# Patient Record
Sex: Female | Born: 1970
Health system: Southern US, Community
[De-identification: ages and names within clinical notes are randomized; demographics above are authoritative.]

## PROBLEM LIST (undated history)

## (undated) DIAGNOSIS — F32A Depression, unspecified: Secondary | ICD-10-CM

## (undated) DIAGNOSIS — D649 Anemia, unspecified: Secondary | ICD-10-CM

## (undated) DIAGNOSIS — I10 Essential (primary) hypertension: Secondary | ICD-10-CM

## (undated) DIAGNOSIS — A419 Sepsis, unspecified organism: Secondary | ICD-10-CM

## (undated) DIAGNOSIS — N201 Calculus of ureter: Secondary | ICD-10-CM

## (undated) DIAGNOSIS — N200 Calculus of kidney: Secondary | ICD-10-CM

## (undated) DIAGNOSIS — E119 Type 2 diabetes mellitus without complications: Secondary | ICD-10-CM

## (undated) DIAGNOSIS — R350 Frequency of micturition: Secondary | ICD-10-CM

## (undated) DIAGNOSIS — Z87442 Personal history of urinary calculi: Secondary | ICD-10-CM

## (undated) DIAGNOSIS — F329 Major depressive disorder, single episode, unspecified: Secondary | ICD-10-CM

## (undated) DIAGNOSIS — B009 Herpesviral infection, unspecified: Secondary | ICD-10-CM

## (undated) HISTORY — DX: Essential (primary) hypertension: I10

## (undated) HISTORY — DX: Herpesviral infection, unspecified: B00.9

## (undated) HISTORY — DX: Frequency of micturition: R35.0

---

## 2008-12-16 ENCOUNTER — Encounter: Admission: RE | Admit: 2008-12-16 | Discharge: 2008-12-16 | Payer: Self-pay | Admitting: Obstetrics and Gynecology

## 2008-12-20 ENCOUNTER — Encounter: Admission: RE | Admit: 2008-12-20 | Discharge: 2008-12-20 | Payer: Self-pay | Admitting: Obstetrics and Gynecology

## 2009-06-07 HISTORY — PX: WISDOM TOOTH EXTRACTION: SHX21

## 2009-08-13 ENCOUNTER — Ambulatory Visit (HOSPITAL_COMMUNITY): Admission: RE | Admit: 2009-08-13 | Discharge: 2009-08-13 | Payer: Self-pay | Admitting: General Surgery

## 2009-08-13 HISTORY — PX: LAPAROSCOPIC CHOLECYSTECTOMY: SUR755

## 2010-01-26 ENCOUNTER — Encounter: Admission: RE | Admit: 2010-01-26 | Discharge: 2010-01-26 | Payer: Self-pay | Admitting: Obstetrics and Gynecology

## 2010-02-02 ENCOUNTER — Encounter: Admission: RE | Admit: 2010-02-02 | Discharge: 2010-02-02 | Payer: Self-pay | Admitting: Obstetrics and Gynecology

## 2010-03-11 ENCOUNTER — Encounter
Admission: RE | Admit: 2010-03-11 | Discharge: 2010-03-11 | Payer: Self-pay | Source: Home / Self Care | Attending: Family Medicine | Admitting: Family Medicine

## 2010-06-29 ENCOUNTER — Encounter: Payer: Self-pay | Admitting: Obstetrics and Gynecology

## 2010-08-31 LAB — COMPREHENSIVE METABOLIC PANEL
ALT: 23 U/L (ref 0–35)
AST: 21 U/L (ref 0–37)
Albumin: 3.4 g/dL — ABNORMAL LOW (ref 3.5–5.2)
Alkaline Phosphatase: 78 U/L (ref 39–117)
BUN: 7 mg/dL (ref 6–23)
GFR calc Af Amer: >60 mL/min (ref >60)
Potassium: 4.1 mEq/L (ref 3.5–5.1)
Sodium: 138 mEq/L (ref 135–145)

## 2010-08-31 LAB — DIFFERENTIAL
Eosinophils Relative: 3 % (ref 0–5)
Lymphocytes Relative: 20 % (ref 12–46)
Lymphs Abs: 1.7 10*3/uL (ref 0.7–4.0)
Monocytes Relative: 7 % (ref 3–12)
Neutro Abs: 5.9 10*3/uL (ref 1.7–7.7)
Neutrophils Relative %: 70 % (ref 43–77)

## 2010-08-31 LAB — CBC
HCT: 40.3 % (ref 36.0–46.0)
RBC: 4.53 MIL/uL (ref 3.87–5.11)
WBC: 8.5 10*3/uL (ref 4.0–10.5)

## 2010-08-31 LAB — PREGNANCY, URINE: Preg Test, Ur: NEGATIVE

## 2011-01-18 ENCOUNTER — Other Ambulatory Visit: Payer: Self-pay | Admitting: Obstetrics and Gynecology

## 2011-01-18 DIAGNOSIS — Z1231 Encounter for screening mammogram for malignant neoplasm of breast: Secondary | ICD-10-CM

## 2011-02-05 ENCOUNTER — Ambulatory Visit
Admission: RE | Admit: 2011-02-05 | Discharge: 2011-02-05 | Disposition: A | Discharge status: Home / Self Care | Payer: Managed Care, Other (non HMO) | Source: Ambulatory Visit | Attending: Obstetrics and Gynecology | Admitting: Obstetrics and Gynecology

## 2011-02-05 DIAGNOSIS — Z1231 Encounter for screening mammogram for malignant neoplasm of breast: Secondary | ICD-10-CM

## 2012-02-28 ENCOUNTER — Other Ambulatory Visit: Payer: Self-pay | Admitting: Obstetrics and Gynecology

## 2012-02-28 DIAGNOSIS — Z139 Encounter for screening, unspecified: Secondary | ICD-10-CM

## 2012-03-06 ENCOUNTER — Inpatient Hospital Stay (HOSPITAL_BASED_OUTPATIENT_CLINIC_OR_DEPARTMENT_OTHER): Admission: RE | Admit: 2012-03-06 | Payer: Managed Care, Other (non HMO) | Source: Ambulatory Visit

## 2012-03-06 ENCOUNTER — Other Ambulatory Visit: Payer: Self-pay | Admitting: Obstetrics and Gynecology

## 2012-03-06 DIAGNOSIS — Z139 Encounter for screening, unspecified: Secondary | ICD-10-CM

## 2012-03-10 ENCOUNTER — Ambulatory Visit
Admission: RE | Admit: 2012-03-10 | Discharge: 2012-03-10 | Disposition: A | Discharge status: Home / Self Care | Payer: Managed Care, Other (non HMO) | Source: Ambulatory Visit | Attending: Obstetrics and Gynecology | Admitting: Obstetrics and Gynecology

## 2012-03-10 DIAGNOSIS — Z139 Encounter for screening, unspecified: Secondary | ICD-10-CM

## 2012-04-13 ENCOUNTER — Ambulatory Visit (INDEPENDENT_AMBULATORY_CARE_PROVIDER_SITE_OTHER): Payer: Private Health Insurance - Indemnity | Admitting: Obstetrics and Gynecology

## 2012-04-13 ENCOUNTER — Encounter: Payer: Self-pay | Admitting: Obstetrics and Gynecology

## 2012-04-13 VITALS — BP 100/70 | Ht 66.5 in | Wt 232.0 lb

## 2012-04-13 DIAGNOSIS — R350 Frequency of micturition: Secondary | ICD-10-CM | POA: Insufficient documentation

## 2012-04-13 DIAGNOSIS — B009 Herpesviral infection, unspecified: Secondary | ICD-10-CM | POA: Insufficient documentation

## 2012-04-13 DIAGNOSIS — L309 Dermatitis, unspecified: Secondary | ICD-10-CM | POA: Insufficient documentation

## 2012-04-13 DIAGNOSIS — I10 Essential (primary) hypertension: Secondary | ICD-10-CM | POA: Insufficient documentation

## 2012-04-13 DIAGNOSIS — Z8619 Personal history of other infectious and parasitic diseases: Secondary | ICD-10-CM | POA: Insufficient documentation

## 2012-04-13 DIAGNOSIS — N83209 Unspecified ovarian cyst, unspecified side: Secondary | ICD-10-CM | POA: Insufficient documentation

## 2012-04-13 DIAGNOSIS — O139 Gestational [pregnancy-induced] hypertension without significant proteinuria, unspecified trimester: Secondary | ICD-10-CM | POA: Insufficient documentation

## 2012-04-13 DIAGNOSIS — Z124 Encounter for screening for malignant neoplasm of cervix: Secondary | ICD-10-CM

## 2012-04-13 DIAGNOSIS — Z01419 Encounter for gynecological examination (general) (routine) without abnormal findings: Secondary | ICD-10-CM

## 2012-04-13 MED ORDER — TRIAMCINOLONE 0.1 % CREAM:EUCERIN CREAM 1:1: 1.0000 "application " | TOPICAL_CREAM | Freq: Three times a day (TID) | CUTANEOUS | Status: DC | PRN

## 2012-04-13 MED ORDER — VALACYCLOVIR HCL 500 MG PO TABS: 500.0000 mg | ORAL_TABLET | Freq: Two times a day (BID) | ORAL | Status: DC

## 2012-04-13 NOTE — Progress Notes (Signed)
The patient reports: some vaginal itching   Contraception:None  Last mammogram: 02/18/2012 Normal Last pap: 03/04/2011   GC/Chlamydia cultures offered: declined HIV/RPR/HbsAg offered:  declined HSV 1 and 2 glycoprotein offered: declined  Menstrual cycle regular and monthly: Yes   Did not have a cycle in September  Menstrual flow normal: Yes  Urinary symptoms: none Normal bowel movements: Yes Reports abuse at home: No:  Subjective:    Katherine Gay is a 41 y.o. female, [id number], who presents for an annual exam.     History   Social History  . Marital Status: Married    Spouse Name: N/A    Number of Children: N/A  . Years of Education: N/A   Social History Main Topics  . Smoking status: Never Smoker   . Smokeless tobacco: Never Used  . Alcohol Use: No  . Drug Use: No  . Sexually Active: Yes   Other Topics Concern  . None   Social History Narrative  . None    Menstrual cycle:   LMP: No LMP recorded.           Cycle: normal except September  The following portions of the patient's history were reviewed and updated as appropriate: allergies, current medications, past family history, past medical history, past social history, past surgical history and problem list.  Review of Systems Pertinent items are noted in HPI. Breast:Negative for breast lump,nipple discharge or nipple retraction Gastrointestinal: Negative for abdominal pain, change in bowel habits or rectal bleeding Urinary:negative   Objective:    There were no vitals taken for this visit.    Weight:  Wt Readings from Last 1 Encounters:  No data found for Wt          BMI: There is no height or weight on file to calculate BMI.  General Appearance: Alert, appropriate appearance for age. No acute distress HEENT: Grossly normal Neck / Thyroid: Supple, no masses, nodes or enlargement Lungs: clear to auscultation bilaterally Back: No CVA tenderness Breast Exam: No masses or nodes.No dimpling, nipple  retraction or discharge. Cardiovascular: Regular rate and rhythm. S1, S2, no murmur Gastrointestinal: Soft, non-tender, no masses or organomegaly Pelvic Exam: Vulva and vagina appear normal. Bimanual exam reveals normal uterus and adnexa. Rectovaginal: normal rectal, no masses Lymphatic Exam: Non-palpable nodes in neck, clavicular, axillary, or inguinal regions Skin: no rash or abnormalities Neurologic: Normal gait and speech, no tremor  Psychiatric: Alert and oriented, appropriate affect.     Assessment:    Normal gyn exam    Plan:    mammogram pap smear return annually or prn STD screening: declined Contraception:no method   Silverio Lay MD

## 2012-04-14 LAB — PAP IG W/ RFLX HPV ASCU

## 2012-04-18 ENCOUNTER — Other Ambulatory Visit: Payer: Self-pay

## 2012-04-18 NOTE — Progress Notes (Signed)
rx verification for triamcinolone / eucerin cream sent to [facility].  Darien Ramus

## 2012-04-24 ENCOUNTER — Other Ambulatory Visit: Payer: Self-pay | Admitting: Obstetrics and Gynecology

## 2012-04-24 NOTE — Telephone Encounter (Signed)
Spoke with pt requesting Valtrex rx. Informed pt Dr. Lynford Humphrey sent rf's for Valtrex on 04/13/12 to [facility]. Pt states she needs a rx called to [facility] today because [facility] home delivery takes 7-14 days. Informed pt we will call in ONE Valcyclovir rx to [facility]. Pt agrees and comprehends.

## 2012-06-10 ENCOUNTER — Other Ambulatory Visit: Payer: Self-pay | Admitting: Obstetrics and Gynecology

## 2012-07-05 ENCOUNTER — Telehealth: Payer: Self-pay | Admitting: Obstetrics and Gynecology

## 2012-07-05 NOTE — Telephone Encounter (Signed)
Per protocol, I called a 90 day supply of Valtrex 500 mg sig: 1 po bid # 180 w 2 RF's to Mail order pharmacy. Also, per SR, I called in Triamcinolone cream 0.1% cream 30 g tube to use qd or bid prn with [facility]. Pt was notified. Melody Comas A

## 2013-02-12 ENCOUNTER — Other Ambulatory Visit: Payer: Self-pay

## 2013-02-12 DIAGNOSIS — Z1231 Encounter for screening mammogram for malignant neoplasm of breast: Secondary | ICD-10-CM

## 2013-03-12 ENCOUNTER — Ambulatory Visit
Admission: RE | Admit: 2013-03-12 | Discharge: 2013-03-12 | Disposition: A | Discharge status: Home / Self Care | Payer: Managed Care, Other (non HMO) | Source: Ambulatory Visit

## 2013-03-12 DIAGNOSIS — Z1231 Encounter for screening mammogram for malignant neoplasm of breast: Secondary | ICD-10-CM

## 2014-02-05 ENCOUNTER — Other Ambulatory Visit: Payer: Self-pay

## 2014-02-05 DIAGNOSIS — Z1231 Encounter for screening mammogram for malignant neoplasm of breast: Secondary | ICD-10-CM

## 2014-03-14 ENCOUNTER — Ambulatory Visit: Payer: Managed Care, Other (non HMO)

## 2014-03-21 ENCOUNTER — Ambulatory Visit
Admission: RE | Admit: 2014-03-21 | Discharge: 2014-03-21 | Disposition: A | Discharge status: Home / Self Care | Payer: Managed Care, Other (non HMO) | Source: Ambulatory Visit

## 2014-03-21 DIAGNOSIS — Z1231 Encounter for screening mammogram for malignant neoplasm of breast: Secondary | ICD-10-CM

## 2014-04-08 ENCOUNTER — Encounter: Payer: Self-pay | Admitting: Obstetrics and Gynecology

## 2014-11-27 ENCOUNTER — Other Ambulatory Visit (HOSPITAL_BASED_OUTPATIENT_CLINIC_OR_DEPARTMENT_OTHER): Payer: Self-pay | Admitting: Physician Assistant

## 2014-11-27 ENCOUNTER — Ambulatory Visit (HOSPITAL_BASED_OUTPATIENT_CLINIC_OR_DEPARTMENT_OTHER)
Admission: RE | Admit: 2014-11-27 | Discharge: 2014-11-27 | Disposition: A | Discharge status: Home / Self Care | Payer: Managed Care, Other (non HMO) | Source: Ambulatory Visit | Attending: Physician Assistant | Admitting: Physician Assistant

## 2014-11-27 DIAGNOSIS — N2882 Megaloureter: Secondary | ICD-10-CM | POA: Insufficient documentation

## 2014-11-27 DIAGNOSIS — R109 Unspecified abdominal pain: Secondary | ICD-10-CM | POA: Diagnosis present

## 2014-11-27 DIAGNOSIS — N200 Calculus of kidney: Secondary | ICD-10-CM | POA: Insufficient documentation

## 2014-11-27 DIAGNOSIS — K760 Fatty (change of) liver, not elsewhere classified: Secondary | ICD-10-CM | POA: Insufficient documentation

## 2015-07-16 ENCOUNTER — Other Ambulatory Visit: Payer: Self-pay | Admitting: Urology

## 2015-07-28 ENCOUNTER — Encounter (HOSPITAL_BASED_OUTPATIENT_CLINIC_OR_DEPARTMENT_OTHER): Payer: Self-pay | Admitting: *Deleted

## 2015-07-29 ENCOUNTER — Encounter (HOSPITAL_BASED_OUTPATIENT_CLINIC_OR_DEPARTMENT_OTHER): Payer: Self-pay | Admitting: *Deleted

## 2015-07-29 NOTE — Progress Notes (Signed)
NPO AFTER MN.  ARRIVE AT 0715.  NEEDS ISTAT AND EKG.  WILL TAKE COZAAR AM DOS W/ SIPS OF WATER AND IF NEEDED TAKE ONE TYPE PAIN MED.

## 2015-07-30 NOTE — Anesthesia Preprocedure Evaluation (Addendum)
Anesthesia Evaluation  Patient identified by MRN, date of birth, ID band Patient awake    Reviewed: Allergy & Precautions, H&P , Patient's Chart, lab work & pertinent test results, reviewed documented beta blocker date and time   Airway Mallampati: II  TM Distance: >3 FB Neck ROM: full    Dental no notable dental hx. (+)    Pulmonary    Pulmonary exam normal breath sounds clear to auscultation       Cardiovascular hypertension, On Medications  Rhythm:regular Rate:Normal     Neuro/Psych    GI/Hepatic   Endo/Other  diabetes  Renal/GU      Musculoskeletal   Abdominal   Peds  Hematology   Anesthesia Other Findings   Reproductive/Obstetrics                            Anesthesia Physical Anesthesia Plan  ASA: II  Anesthesia Plan:    Post-op Pain Management:    Induction: Intravenous  Airway Management Planned: Oral ETT  Additional Equipment:   Intra-op Plan:   Post-operative Plan:   Informed Consent: I have reviewed the patients History and Physical, chart, labs and discussed the procedure including the risks, benefits and alternatives for the proposed anesthesia with the patient or authorized representative who has indicated his/her understanding and acceptance.   Dental Advisory Given and Dental advisory given  Plan Discussed with: CRNA and Surgeon  Anesthesia Plan Comments: (Am BS 208 Discussed GA with ETT, possible sore throat, potential dental injury( upper Caps)  N/V, pulmonary aspiration. Questions answered. )       Anesthesia Quick Evaluation

## 2015-08-01 ENCOUNTER — Encounter (HOSPITAL_BASED_OUTPATIENT_CLINIC_OR_DEPARTMENT_OTHER): Payer: Self-pay | Admitting: *Deleted

## 2015-08-01 ENCOUNTER — Ambulatory Visit (HOSPITAL_BASED_OUTPATIENT_CLINIC_OR_DEPARTMENT_OTHER): Payer: Managed Care, Other (non HMO) | Admitting: Anesthesiology

## 2015-08-01 ENCOUNTER — Encounter (HOSPITAL_BASED_OUTPATIENT_CLINIC_OR_DEPARTMENT_OTHER)
Admission: RE | Disposition: A | Discharge status: Home / Self Care | Payer: Self-pay | Source: Ambulatory Visit | Attending: Urology

## 2015-08-01 ENCOUNTER — Ambulatory Visit (HOSPITAL_BASED_OUTPATIENT_CLINIC_OR_DEPARTMENT_OTHER)
Admission: RE | Admit: 2015-08-01 | Discharge: 2015-08-01 | Disposition: A | Discharge status: Home / Self Care | Payer: Managed Care, Other (non HMO) | Source: Ambulatory Visit | Attending: Urology | Admitting: Urology

## 2015-08-01 DIAGNOSIS — R109 Unspecified abdominal pain: Secondary | ICD-10-CM | POA: Diagnosis present

## 2015-08-01 DIAGNOSIS — N201 Calculus of ureter: Secondary | ICD-10-CM | POA: Insufficient documentation

## 2015-08-01 DIAGNOSIS — N200 Calculus of kidney: Secondary | ICD-10-CM

## 2015-08-01 DIAGNOSIS — I10 Essential (primary) hypertension: Secondary | ICD-10-CM | POA: Diagnosis not present

## 2015-08-01 DIAGNOSIS — Z7984 Long term (current) use of oral hypoglycemic drugs: Secondary | ICD-10-CM | POA: Insufficient documentation

## 2015-08-01 DIAGNOSIS — Z841 Family history of disorders of kidney and ureter: Secondary | ICD-10-CM | POA: Diagnosis not present

## 2015-08-01 DIAGNOSIS — Z79899 Other long term (current) drug therapy: Secondary | ICD-10-CM | POA: Insufficient documentation

## 2015-08-01 DIAGNOSIS — E119 Type 2 diabetes mellitus without complications: Secondary | ICD-10-CM | POA: Insufficient documentation

## 2015-08-01 DIAGNOSIS — Z791 Long term (current) use of non-steroidal anti-inflammatories (NSAID): Secondary | ICD-10-CM | POA: Diagnosis not present

## 2015-08-01 DIAGNOSIS — Z79891 Long term (current) use of opiate analgesic: Secondary | ICD-10-CM | POA: Diagnosis not present

## 2015-08-01 HISTORY — DX: Depression, unspecified: F32.A

## 2015-08-01 HISTORY — DX: Type 2 diabetes mellitus without complications: E11.9

## 2015-08-01 HISTORY — DX: Calculus of ureter: N20.1

## 2015-08-01 HISTORY — PX: HOLMIUM LASER APPLICATION: SHX5852

## 2015-08-01 HISTORY — PX: CYSTOSCOPY WITH RETROGRADE PYELOGRAM, URETEROSCOPY AND STENT PLACEMENT: SHX5789

## 2015-08-01 HISTORY — DX: Major depressive disorder, single episode, unspecified: F32.9

## 2015-08-01 HISTORY — DX: Calculus of kidney: N20.0

## 2015-08-01 LAB — POCT I-STAT, CHEM 8
BUN: 11 mg/dL (ref 6–20)
CALCIUM ION: 1.21 mmol/L (ref 1.12–1.23)
CREATININE: 0.6 mg/dL (ref 0.44–1.00)
Chloride: 102 mmol/L (ref 101–111)
GLUCOSE: 208 mg/dL — AB (ref 65–99)
HEMATOCRIT: 44 % (ref 36.0–46.0)
Hemoglobin: 15 g/dL (ref 12.0–15.0)
Potassium: 4.2 mmol/L (ref 3.5–5.1)
Sodium: 139 mmol/L (ref 135–145)
TCO2: 22 mmol/L (ref 0–100)

## 2015-08-01 LAB — GLUCOSE, CAPILLARY: Glucose-Capillary: 148 mg/dL — ABNORMAL HIGH (ref 65–99)

## 2015-08-01 SURGERY — CYSTOURETEROSCOPY, WITH RETROGRADE PYELOGRAM AND STENT INSERTION
Anesthesia: General | Site: Ureter | Laterality: Right

## 2015-08-01 MED ORDER — CIPROFLOXACIN IN D5W 400 MG/200ML IV SOLN
INTRAVENOUS | Status: AC
  Filled 2015-08-01: qty 200

## 2015-08-01 MED ORDER — MIDAZOLAM HCL 5 MG/5ML IJ SOLN
INTRAMUSCULAR | Status: DC | PRN
  Administered 2015-08-01: 2 mg via INTRAVENOUS

## 2015-08-01 MED ORDER — MIDAZOLAM HCL 2 MG/2ML IJ SOLN
INTRAMUSCULAR | Status: AC
  Filled 2015-08-01: qty 2

## 2015-08-01 MED ORDER — ONDANSETRON HCL 4 MG/2ML IJ SOLN
INTRAMUSCULAR | Status: AC
  Filled 2015-08-01: qty 2

## 2015-08-01 MED ORDER — ONDANSETRON HCL 4 MG/2ML IJ SOLN
4.0000 mg | Freq: Once | INTRAMUSCULAR | Status: AC
  Administered 2015-08-01: 4 mg via INTRAVENOUS
  Filled 2015-08-01: qty 2

## 2015-08-01 MED ORDER — LIDOCAINE HCL (CARDIAC) 20 MG/ML IV SOLN
INTRAVENOUS | Status: AC
  Filled 2015-08-01: qty 5

## 2015-08-01 MED ORDER — CIPROFLOXACIN IN D5W 400 MG/200ML IV SOLN
400.0000 mg | INTRAVENOUS | Status: AC
  Administered 2015-08-01: 400 mg via INTRAVENOUS
  Filled 2015-08-01: qty 200

## 2015-08-01 MED ORDER — DEXAMETHASONE SODIUM PHOSPHATE 10 MG/ML IJ SOLN
INTRAMUSCULAR | Status: AC
  Filled 2015-08-01: qty 1

## 2015-08-01 MED ORDER — PROMETHAZINE HCL 25 MG/ML IJ SOLN
INTRAMUSCULAR | Status: AC
  Filled 2015-08-01: qty 1

## 2015-08-01 MED ORDER — ACETAMINOPHEN 160 MG/5ML PO SOLN
975.0000 mg | Freq: Once | ORAL | Status: AC
  Administered 2015-08-01: 975 mg via ORAL
  Filled 2015-08-01: qty 30.5

## 2015-08-01 MED ORDER — ROCURONIUM BROMIDE 100 MG/10ML IV SOLN
INTRAVENOUS | Status: AC
  Filled 2015-08-01: qty 1

## 2015-08-01 MED ORDER — FENTANYL CITRATE (PF) 100 MCG/2ML IJ SOLN
INTRAMUSCULAR | Status: AC
  Filled 2015-08-01: qty 2

## 2015-08-01 MED ORDER — PROPOFOL 10 MG/ML IV BOLUS
INTRAVENOUS | Status: AC
  Filled 2015-08-01: qty 20

## 2015-08-01 MED ORDER — SODIUM CHLORIDE 0.9 % IR SOLN
Status: DC | PRN
  Administered 2015-08-01: 3000 mL
  Administered 2015-08-01: 1000 mL

## 2015-08-01 MED ORDER — FENTANYL CITRATE (PF) 250 MCG/5ML IJ SOLN
INTRAMUSCULAR | Status: AC
  Filled 2015-08-01: qty 5

## 2015-08-01 MED ORDER — KETOROLAC TROMETHAMINE 30 MG/ML IJ SOLN
INTRAMUSCULAR | Status: AC
  Filled 2015-08-01: qty 1

## 2015-08-01 MED ORDER — SCOPOLAMINE 1 MG/3DAYS TD PT72
MEDICATED_PATCH | TRANSDERMAL | Status: AC
  Filled 2015-08-01: qty 1

## 2015-08-01 MED ORDER — GLYCOPYRROLATE 0.2 MG/ML IJ SOLN
INTRAMUSCULAR | Status: AC
  Filled 2015-08-01: qty 1

## 2015-08-01 MED ORDER — LACTATED RINGERS IV SOLN
INTRAVENOUS | Status: DC
  Administered 2015-08-01 (×2): via INTRAVENOUS
  Filled 2015-08-01: qty 1000

## 2015-08-01 MED ORDER — TROSPIUM CHLORIDE ER 60 MG PO CP24: 60.0000 mg | ORAL_CAPSULE | Freq: Every day | ORAL | Status: DC

## 2015-08-01 MED ORDER — FENTANYL CITRATE (PF) 100 MCG/2ML IJ SOLN
25.0000 ug | INTRAMUSCULAR | Status: DC | PRN
  Administered 2015-08-01 (×3): 25 ug via INTRAVENOUS
  Filled 2015-08-01: qty 1

## 2015-08-01 MED ORDER — ONDANSETRON HCL 4 MG/2ML IJ SOLN
INTRAMUSCULAR | Status: DC | PRN
  Administered 2015-08-01: 4 mg via INTRAVENOUS

## 2015-08-01 MED ORDER — ROCURONIUM BROMIDE 100 MG/10ML IV SOLN
INTRAVENOUS | Status: DC | PRN
  Administered 2015-08-01: 10 mg via INTRAVENOUS

## 2015-08-01 MED ORDER — CIPROFLOXACIN HCL 500 MG PO TABS: 500.0000 mg | ORAL_TABLET | Freq: Once | ORAL | Status: DC

## 2015-08-01 MED ORDER — PROPOFOL 10 MG/ML IV BOLUS
INTRAVENOUS | Status: DC | PRN
  Administered 2015-08-01: 20 mg via INTRAVENOUS
  Administered 2015-08-01: 200 mg via INTRAVENOUS

## 2015-08-01 MED ORDER — FENTANYL CITRATE (PF) 100 MCG/2ML IJ SOLN
INTRAMUSCULAR | Status: DC | PRN
  Administered 2015-08-01 (×2): 50 ug via INTRAVENOUS
  Administered 2015-08-01: 150 ug via INTRAVENOUS

## 2015-08-01 MED ORDER — BELLADONNA ALKALOIDS-OPIUM 16.2-60 MG RE SUPP
RECTAL | Status: AC
  Filled 2015-08-01: qty 1

## 2015-08-01 MED ORDER — SUCCINYLCHOLINE CHLORIDE 20 MG/ML IJ SOLN
INTRAMUSCULAR | Status: DC | PRN
  Administered 2015-08-01: 80 mg via INTRAVENOUS

## 2015-08-01 MED ORDER — BELLADONNA ALKALOIDS-OPIUM 16.2-60 MG RE SUPP
RECTAL | Status: DC | PRN
Start: 2015-08-01 — End: 2015-08-01
  Administered 2015-08-01: 1 via RECTAL

## 2015-08-01 MED ORDER — IOHEXOL 350 MG/ML SOLN
INTRAVENOUS | Status: DC | PRN
  Administered 2015-08-01: 20 mL

## 2015-08-01 MED ORDER — PROMETHAZINE HCL 25 MG/ML IJ SOLN
6.2500 mg | Freq: Once | INTRAMUSCULAR | Status: AC
  Administered 2015-08-01: 6.25 mg via INTRAVENOUS
  Filled 2015-08-01: qty 1

## 2015-08-01 MED ORDER — KETOROLAC TROMETHAMINE 30 MG/ML IJ SOLN
30.0000 mg | Freq: Once | INTRAMUSCULAR | Status: AC
  Administered 2015-08-01: 30 mg via INTRAVENOUS
  Filled 2015-08-01: qty 1

## 2015-08-01 MED ORDER — SCOPOLAMINE 1 MG/3DAYS TD PT72
1.0000 | MEDICATED_PATCH | TRANSDERMAL | Status: DC
  Administered 2015-08-01: 1.5 mg via TRANSDERMAL
  Filled 2015-08-01: qty 1

## 2015-08-01 MED ORDER — LIDOCAINE HCL (CARDIAC) 20 MG/ML IV SOLN
INTRAVENOUS | Status: DC | PRN
  Administered 2015-08-01: 80 mg via INTRAVENOUS

## 2015-08-01 MED ORDER — ACETAMINOPHEN 160 MG/5ML PO SOLN
ORAL | Status: AC
  Filled 2015-08-01: qty 40.6

## 2015-08-01 MED ORDER — ACETAMINOPHEN-CODEINE #3 300-30 MG PO TABS: 1.0000 | ORAL_TABLET | ORAL | Status: DC | PRN

## 2015-08-01 MED ORDER — PHENAZOPYRIDINE HCL 200 MG PO TABS: 200.0000 mg | ORAL_TABLET | Freq: Three times a day (TID) | ORAL | Status: DC | PRN

## 2015-08-01 SURGICAL SUPPLY — 34 items
BAG DRAIN URO-CYSTO SKYTR STRL (DRAIN) ×2 IMPLANT
BASKET DAKOTA 1.9FR 11X120 (BASKET) ×2 IMPLANT
BASKET LASER NITINOL 1.9FR (BASKET) IMPLANT
BASKET STNLS GEMINI 4WIRE 3FR (BASKET) IMPLANT
BASKET STONE NCOMPASS (UROLOGICAL SUPPLIES) ×2 IMPLANT
BASKET ZERO TIP NITINOL 2.4FR (BASKET) IMPLANT
CATH URET 5FR 28IN OPEN ENDED (CATHETERS) ×4 IMPLANT
CLOTH BEACON ORANGE TIMEOUT ST (SAFETY) ×2 IMPLANT
FIBER LASER TRAC TIP (UROLOGICAL SUPPLIES) ×2 IMPLANT
GLOVE BIO SURGEON STRL SZ 6.5 (GLOVE) ×6 IMPLANT
GLOVE BIO SURGEON STRL SZ7.5 (GLOVE) ×2 IMPLANT
GLOVE INDICATOR 6.5 STRL GRN (GLOVE) ×6 IMPLANT
GOWN STRL REUS W/ TWL LRG LVL3 (GOWN DISPOSABLE) ×2 IMPLANT
GOWN STRL REUS W/ TWL XL LVL3 (GOWN DISPOSABLE) ×1 IMPLANT
GOWN STRL REUS W/TWL LRG LVL3 (GOWN DISPOSABLE) ×2
GOWN STRL REUS W/TWL XL LVL3 (GOWN DISPOSABLE) ×1
GUIDEWIRE 0.038 PTFE COATED (WIRE) ×2 IMPLANT
GUIDEWIRE ANG ZIPWIRE 038X150 (WIRE) IMPLANT
GUIDEWIRE STR DUAL SENSOR (WIRE) ×2 IMPLANT
IV NS 1000ML (IV SOLUTION) ×1
IV NS 1000ML BAXH (IV SOLUTION) ×1 IMPLANT
IV NS IRRIG 3000ML ARTHROMATIC (IV SOLUTION) ×2 IMPLANT
KIT BALLIN UROMAX 15FX10 (LABEL) IMPLANT
KIT BALLN UROMAX 15FX4 (MISCELLANEOUS) IMPLANT
KIT BALLN UROMAX 26 75X4 (MISCELLANEOUS)
KIT ROOM TURNOVER WOR (KITS) ×2 IMPLANT
MANIFOLD NEPTUNE II (INSTRUMENTS) ×2 IMPLANT
NS IRRIG 500ML POUR BTL (IV SOLUTION) IMPLANT
PACK CYSTO (CUSTOM PROCEDURE TRAY) ×2 IMPLANT
SET HIGH PRES BAL DIL (LABEL)
SHEATH ACCESS URETERAL 38CM (SHEATH) ×2 IMPLANT
STENT URET 6FRX24 CONTOUR (STENTS) ×2 IMPLANT
TUBE CONNECTING 12X1/4 (SUCTIONS) ×2 IMPLANT
WATER STERILE IRR 500ML POUR (IV SOLUTION) ×2 IMPLANT

## 2015-08-01 NOTE — Transfer of Care (Signed)
Immediate Anesthesia Transfer of Care Note  Patient: Katherine Gay  Procedure(s) Performed: Procedure(s): CYSTOSCOPY WITH RIGHT RETROGRADE PYELOGRAM, URETEROSCOPY AND STENT PLACEMENT (Right) HOLMIUM LASER APPLICATION (Right)  Patient Location: PACU  Anesthesia Type:General  Level of Consciousness: awake, alert  and oriented  Airway & Oxygen Therapy: Patient Spontanous Breathing and Patient connected to nasal cannula oxygen  Post-op Assessment: Report given to RN and Post -op Vital signs reviewed and stable  Post vital signs: Reviewed and stable  Last Vitals:  Filed Vitals:   08/01/15 0728  BP: 125/82  Pulse: 71  Temp: 36.4 C  Resp: 16    Complications: No apparent anesthesia complications

## 2015-08-01 NOTE — Anesthesia Postprocedure Evaluation (Signed)
Anesthesia Post Note  Patient: Katherine Gay  Procedure(s) Performed: Procedure(s) (LRB): CYSTOSCOPY WITH RIGHT RETROGRADE PYELOGRAM, URETEROSCOPY AND STENT PLACEMENT (Right) HOLMIUM LASER APPLICATION (Right)  Patient location during evaluation: PACU Anesthesia Type: General Level of consciousness: sedated Pain management: satisfactory to patient Vital Signs Assessment: post-procedure vital signs reviewed and stable Respiratory status: spontaneous breathing Cardiovascular status: stable Anesthetic complications: no    Last Vitals:  Filed Vitals:   08/01/15 1245 08/01/15 1250  BP: 123/89   Pulse: 55 55  Temp:    Resp: 10 12    Last Pain:  Filed Vitals:   08/01/15 1300  PainSc: 6                  Darolyn Double EDWARD

## 2015-08-01 NOTE — Op Note (Signed)
Preoperative diagnosis: right ureteral calculus  Postoperative diagnosis: right ureteral calculus  Procedure:  1. Cystoscopy 2. right ureteroscopy and stone removal 3. Ureteroscopic laser lithotripsy 4. right 42F x 24cm ureteral stent placement  5. right retrograde pyelography with interpretation  Surgeon: Crist Fat, MD  Anesthesia: General  Complications: None  Intraoperative findings: Right retrograde pyelography demonstrated a filling defect within the right UPJ consistent with the patient's known calculus without other abnormalities.  EBL: Minimal  Specimens: 1. right ureteral calculus  Disposition of specimens: [facility] for stone analysis  Indication: Katherine Gay is a 45 y.o.   patient with urolithiasis. After reviewing the management options for treatment, the patient elected to proceed with the above surgical procedure(s). We have discussed the potential benefits and risks of the procedure, side effects of the proposed treatment, the likelihood of the patient achieving the goals of the procedure, and any potential problems that might occur during the procedure or recuperation. Informed consent has been obtained.  Description of procedure:  The patient was taken to the operating room and general anesthesia was induced.  The patient was placed in the dorsal lithotomy position, prepped and draped in the usual sterile fashion, and preoperative antibiotics were administered. A preoperative time-out was performed.   Cystourethroscopy was performed.  The patient's urethra was examined and was normal. The bladder was then systematically examined in its entirety. There was no evidence for any bladder tumors, stones, or other mucosal pathology.    Attention then turned to the right ureteral orifice and a ureteral catheter was used to intubate the ureteral orifice.  Omnipaque contrast was injected through the ureteral catheter and a retrograde pyelogram  was performed with findings as dictated above.  A 0.38 sensor guidewire was then advanced up the right ureter into the renal pelvis under fluoroscopic guidance. The 6 Fr semirigid ureteroscope was then advanced into the ureter next to the guidewire and up to the renal pelvis. I then advanced a second PTFE wire through the rigid scope and into the renal pelvis backing out the scope over the wire. I then used a inner portion of the 12/14 [place] ureteral access sheath and dilated the distal ureter. I was unable to advance the ureteral access sheath into the ureter without significant pressure and up into the proximal ureter. I then removed the inner portion of the access sheath along with the PTFE wire. I then advanced a flexible ureteroscope through the sheath and into the renal pelvis..   The stone encountered and was then fragmented with the 200 micron holmium laser fiber on a setting of 0.6 and frequency of 6 Hz.   All large stone fragments were then removed from the ureter with an Stollings nitinol basket.  There were numerous small bits of stone that were too small to be grasped with the basket which were left in the renal pelvic area. I then swept the ureter using the escape basket ensuring that all stone fragments had been removed and simultaneously backing out the ureteral access sheath. No significant ureteral trauma was noted.  The open-ended ureteral catheter was then advanced over the 0.38 sensor wire and into the proximal ureter removing the wire. I then injected 5 mL Omnipaque contrast into the collecting system to help opacify it. I then exchanged the open-ended catheter. The wire again and advanced a 6 [place] times 24 cm double-J ureteral stent over the wire and into the right renal pelvis under fluoroscopic guidance. I then advanced a wire  to the urethral meatus and with pressure against the suprapubic bone and wire all the wire back. Stent was noted to coil into the bladder and a nice curl was  noted in the renal pelvis as well as under fluoroscopy. The stent tether was left on the distal and of the stent and tucked into the patient's vagina.  The bladder was then emptied and the procedure ended.  The patient appeared to tolerate the procedure well and without complications.  The patient was able to be awakened and transferred to the recovery unit in satisfactory condition.   Disposition: The tether of the stent was left on and tucked inside the patient's vagina.  Instructions for removing the stent have been provided to the patient. This has been scheduled for followup in 6 weeks with a renal ultrasound.

## 2015-08-01 NOTE — Discharge Instructions (Signed)
DISCHARGE INSTRUCTIONS FOR KIDNEY STONE/URETERAL STENT   Over the next 7 days you need to drink as much fluids as you can!  MEDICATIONS:  1.  Resume all your other meds from home - except do not take any extra narcotic pain meds that you may have at home.  2. Trospium is to prevent bladder spasms and help reduce urinary frequency. 3. Pyridium is to help with the burning/stinging when you urinate. 4. Tylenol with codeine is for moderate/severe pain, otherwise taking upto  every 6 hours of plainTylenol will help treat your pain.  Do not take both at the same time. 5. Take Cipro one hour prior to removal of your stent.   ACTIVITY:  1. No strenuous activity x 1week  2. No driving while on narcotic pain medications  3. Drink plenty of water  4. Continue to walk at home - you can still get blood clots when you are at home, so keep active, but don't over do it.  5. May return to work/school tomorrow or when you feel ready   BATHING:  1. You can shower and we recommend daily showers  2. You have a string coming from your urethra: The stent string is attached to your ureteral stent. Do not pull on this.   SIGNS/SYMPTOMS TO CALL:  Please call us if you have a fever greater than 101.5, uncontrolled nausea/vomiting, uncontrolled pain, dizziness, unable to urinate, bloody urine, chest pain, shortness of breath, leg swelling, leg pain, redness around wound, drainage from wound, or any other concerns or questions.   You can reach [place] at [phone].   FOLLOW-UP:  1. You have an appointment in 6 weeks with a ultrasound of your kidneys prior.   2. You have a string attached to your stent, you may remove it on Wednesday March 1st. To do this, pull the string until the stent are completely removed. You may feel an odd sensation in your back.      Post Anesthesia Home Care Instructions  Activity: Get plenty of rest for the remainder of the day. A responsible adult should stay with you for 24  hours following the procedure.  For the next 24 hours, DO NOT: -Drive a car -Advertising copywriter -Drink alcoholic beverages -Take any medication unless instructed by your physician -Make any legal decisions or sign important papers.  Meals: Start with liquid foods such as gelatin or soup. Progress to regular foods as tolerated. Avoid greasy, spicy, heavy foods. If nausea and/or vomiting occur, drink only clear liquids until the nausea and/or vomiting subsides. Call your physician if vomiting continues.  Special Instructions/Symptoms: Your throat may feel dry or sore from the anesthesia or the breathing tube placed in your throat during surgery. If this causes discomfort, gargle with warm salt water. The discomfort should disappear within 24 hours.  If you had a scopolamine patch placed behind your ear for the management of post- operative nausea and/or vomiting:  1. The medication in the patch is effective for 72 hours, after which it should be removed.  Wrap patch in a tissue and discard in the trash. Wash hands thoroughly with soap and water. 2. You may remove the patch earlier than 72 hours if you experience unpleasant side effects which may include dry mouth, dizziness or visual disturbances. 3. Avoid touching the patch. Wash your hands with soap and water after contact with the patch.

## 2015-08-01 NOTE — Anesthesia Procedure Notes (Signed)
Procedure Name: Intubation Date/Time: 08/01/2015 8:56 AM Performed by: Rica Records Pre-anesthesia Checklist: Patient identified, Emergency Drugs available and Suction available Patient Re-evaluated:Patient Re-evaluated prior to inductionOxygen Delivery Method: Circle system utilized Preoxygenation: Pre-oxygenation with 100% oxygen Intubation Type: IV induction Ventilation: Mask ventilation without difficulty Laryngoscope Size: Miller and 2 Grade View: Grade II Tube type: Oral Tube size: 7.0 mm Number of attempts: 1 Airway Equipment and Method: Stylet Placement Confirmation: ETT inserted through vocal cords under direct vision,  positive ETCO2 and breath sounds checked- equal and bilateral Secured at: 20 cm Tube secured with: Tape Dental Injury: Teeth and Oropharynx as per pre-operative assessment

## 2015-08-01 NOTE — Interval H&P Note (Signed)
History and Physical Interval Note:  08/01/2015 8:19 AM  Katherine Gay  has presented today for surgery, with the diagnosis of RIGHT URETERAL STONE  The various methods of treatment have been discussed with the patient and family. After consideration of risks, benefits and other options for treatment, the patient has consented to  Procedure(s): CYSTOSCOPY WITH RIGHT RETROGRADE PYELOGRAM, URETEROSCOPY AND STENT PLACEMENT (Right) HOLMIUM LASER APPLICATION (Right) as a surgical intervention .  The patient's history has been reviewed, patient examined, no change in status, stable for surgery.  I have reviewed the patient's chart and labs.  Questions were answered to the patient's satisfaction.     Berniece Salines W

## 2015-08-01 NOTE — H&P (Signed)
Reason For Visit flank pain   History of Present Illness 68F with known bilateral nephrolithiasis. She was recently seen for gross hematuria and workup was negative except for the known kidney stones. She did have a abnormal area on the posterior wall of her bladder which we recommended following in 6 months with a repeat cystoscopy. However, over the recent days/weeks the patient has had progressive, continuous flank pain. The patient states that yesterday her pain was as bad as it has been. Her pain is predominantly in the right lower quadrant. She is also having some right flank pain. She has not passed any stones that she knows of. She has not had any significant gross hematuria recently. She denies any fevers or chills. She has not had any associated voiding symptoms. Her bowels are moving normally, she is not having any nausea   Past Medical History Problems  1. History of depression (Z86.59) 2. History of diabetes mellitus (Z86.39) 3. History of hypertension (Z86.79)  Surgical History Problems  1. History of Cesarean Section 2. History of Cholecystectomy 3. History of Oral Surgery  Current Meds 1. Hydrocodone-Acetaminophen 5-325 MG Oral Tablet; TAKE 1 TABLET Every  4 hours;  Therapy: 18Jul2016 to (Last Rx:18Jul2016) Ordered 2. Hyophen 81.6 MG Oral Tablet; take 1 capsule   4  times a day prn;  Therapy: 20Dec2016 to (Last Rx:20Dec2016)  Requested for: 20Dec2016 Ordered 3. Ibuprofen TABS;  Therapy: (Recorded:18Jul2016) to Recorded 4. Losartan Potassium-HCTZ 100-12.5 MG Oral Tablet;  Therapy: (Recorded:18Jul2016) to Recorded 5. MetFORMIN HCl - 500 MG Oral Tablet;  Therapy: (Recorded:18Jul2016) to Recorded 6. Progesterone Milled Powder;  Therapy: (Recorded:18Jul2016) to Recorded 7. ValACYclovir HCl - 500 MG Oral Tablet;  Therapy: (Recorded:18Jul2016) to Recorded 8. Zyrtec TABS;  Therapy: (Recorded:18Jul2016) to Recorded  Allergies Medication  1. No Known Drug  Allergies  Family History Problems  1. Family history of kidney stones (Z84.1) : Mother  Social History Problems  1. Denied: History of Alcohol use 2. Caffeine use (F15.90) 3. Married 4. Mother alive and healthy   64 5. Never a smoker 6. Number of children   1 daughter 7. 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  Vitals Vital Signs [Data Includes: Last 1 Day]  Recorded: 02Feb2017 02:57PM  Blood Pressure: 110 / 74 Temperature: 97.5 F Heart Rate: 111  Physical Exam No acute distress  Heart sounds are normal without evidence of murmur, regular rate  Lungs sounds are clear to auscultation bilaterally  Abdomen is soft, nontender, nondistended, minimal tenderness in the right   Results/Data Urine [Data Includes: Last 1 Day]   02Feb2017  COLOR RED   APPEARANCE TURBID   SPECIFIC GRAVITY 1.025   pH 5.0   GLUCOSE NEGATIVE   BILIRUBIN NEGATIVE   KETONE TRACE   BLOOD 3+   PROTEIN 2+   NITRITE NEGATIVE   LEUKOCYTE ESTERASE TRACE   SQUAMOUS EPITHELIAL/HPF NONE SEEN HPF  WBC 0-5 WBC/HPF  RBC >60 RBC/HPF  BACTERIA FEW HPF  CRYSTALS NONE SEEN HPF  CASTS NONE SEEN LPF  Yeast NONE SEEN HPF   Urinalysis demonstrates microscopic hematuria without clear evidence of infection.  The patient has a CT scan was performed today in clinic which I have independently reviewed. This demonstrates a 10 mm stone at the right UPJ. She also has a small stone in the left kidney.   Assessment Assessed  1. Urinary tract infection (N39.0) 2. Nephrolithiasis (N20.0) 3. Gross hematuria (R31.0)  Plan Gross hematuria  1. AU CT-STONE PROTOCOL; Status:Resulted - Requires Verification;  Done: 02Feb2017  12:00AM Health Maintenance  2. UA With REFLEX; [Do Not Release]; Status:Complete;   Done: 02Feb2017 02:47PM Nephrolithiasis  3. Stop: Hydrocodone-Acetaminophen 5-325 MG Oral Tablet 4. Start: TraMADol HCl - 50 MG Oral Tablet; TAKE 1-2 TABLETS BY MOUTH EVERY 4-6  HOURS AS NEEDED 5. URINE CULTURE;  Status:In Progress - Specimen/Data Collected;   Done: 02Feb2017  Discussion/Summary The patient is intermittently symptomatic from her right UPJ stone. She is here to have something done about this. We discussed treatment options for this stone including shockwave lithotripsy and ureteroscopy. I detailed each of these surgeries for the patient quite some detail. She understands the risks and benefits of each surgery. She was having a hard time deciding, she will contact our office with her final decision.

## 2015-08-04 ENCOUNTER — Encounter (HOSPITAL_BASED_OUTPATIENT_CLINIC_OR_DEPARTMENT_OTHER): Payer: Self-pay | Admitting: Urology

## 2017-04-27 ENCOUNTER — Other Ambulatory Visit: Payer: Managed Care, Other (non HMO)

## 2017-07-04 ENCOUNTER — Ambulatory Visit: Payer: Self-pay | Admitting: Dietician

## 2017-07-21 ENCOUNTER — Encounter: Payer: Self-pay | Admitting: Dietician

## 2017-07-21 ENCOUNTER — Encounter: Payer: Commercial Managed Care - PPO | Attending: Physician Assistant | Admitting: Dietician

## 2017-07-21 DIAGNOSIS — Z713 Dietary counseling and surveillance: Secondary | ICD-10-CM | POA: Diagnosis not present

## 2017-07-21 DIAGNOSIS — E119 Type 2 diabetes mellitus without complications: Secondary | ICD-10-CM | POA: Diagnosis not present

## 2017-07-21 NOTE — Progress Notes (Signed)
Diabetes Self-Management Education  Visit Type: First/Initial  Appt. Start Time: 1030 Appt. End Time: 1200  07/21/2017  [name], identified by name and date of birth, is a 47 y.o. female with a diagnosis of Diabetes: Type 2. Other history includes HTN and kidney stone.  Her lipids are generally good but discussed benefit of exercise to increase her HDL.   Patient has been a vegetarian plus seafood since she was 53 yo.  Her mother did not know what to feed her so she was fed a lot of salad.  She does not like salad.  She does like black beans and refried beans but otherwise no legumes.  What she likes is different from her husband's preferences.  They feel that they are not setting a healthy example for their daughter.  They do not eat family dinner but eat at different times and different places.  She often eats out or take out even though she prepares food for her daughter and husband.  Her husband cooks breakfast and generally she does the shopping.  She has just started a Zumba class on Thursday.  She does not feel that she follows a very healthy vegetarian diet. She saw a nutritionist in the past but was only told to stop eating ranch.  She has not had diabetes education in the past.  She follows a vegetarian diet due to preference and animal welfare.  Weight hx: 220 lbs today 264 lbs 2012 when she was diagnosed with diabetes and lost with mindfulness and decreasing portions (She used to order a whole pizza and eat it all.) Lowest adult weight 160 lbs and was 170 lbs when she got married in 2000.   ASSESSMENT  Height 5' 6.5" (1.689 m), weight 220 lb (99.8 kg). Body mass index is 34.98 kg/m.  Diabetes Self-Management Education - 07/21/17 1056      Visit Information   Visit Type  First/Initial      Initial Visit   Diabetes Type  Type 2    Are you currently following a meal plan?  Yes    What type of meal plan do you follow?  pescatarian    Are you taking your medications as  prescribed?  Yes    Date Diagnosed  2012      Health Coping   How would you rate your overall health?  Good      Psychosocial Assessment   Patient Belief/Attitude about Diabetes  Motivated to manage diabetes also afraid and in denial    Self-care barriers  None    Self-management support  Doctor's office;Family    Other persons present  Patient    Patient Concerns  Nutrition/Meal planning;Weight Control    Special Needs  None    Preferred Learning Style  No preference indicated    Learning Readiness  Ready    How often do you need to have someone help you when you read instructions, pamphlets, or other written materials from your doctor or pharmacy?  1 - Never    What is the last grade level you completed in school?  4 years college      Pre-Education Assessment   Patient understands the diabetes disease and treatment process.  Needs Review    Patient understands incorporating nutritional management into lifestyle.  Needs Review    Patient undertands incorporating physical activity into lifestyle.  Needs Review    Patient understands using medications safely.  Needs Review    Patient understands monitoring blood glucose, interpreting  and using results  Needs Review    Patient understands prevention, detection, and treatment of acute complications.  Needs Review    Patient understands prevention, detection, and treatment of chronic complications.  Needs Review    Patient understands how to develop strategies to address psychosocial issues.  Needs Review    Patient understands how to develop strategies to promote health/change behavior.  Needs Review      Complications   Last HgB A1C per patient/outside source  7 % 06/2015    How often do you check your blood sugar?  0 times/day (not testing) fearful of testing    Have you had a dilated eye exam in the past 12 months?  No    Have you had a dental exam in the past 12 months?  Yes    Are you checking your feet?  Yes    How many days per  week are you checking your feet?  2      Dietary Intake   Breakfast  (generally dislikes)- fruit or biscuit or cereal and milk    Snack (morning)  none    Lunch  eats out often (shrimp wrap, vegeburger, grilled cheese, sub, pizza)    Snack (afternoon)  none    Dinner  cooks for her family but often gets food eaten out for herself- no family meals generally    Snack (evening)  none    Beverage(s)  sugar free lemonade, unsweetened tea with stevia, water, diet Mt. Dew      Exercise   Exercise Type  Light (walking / raking leaves) Zumba once per week and likes walks    How many days per week to you exercise?  1    How many minutes per day do you exercise?  60    Total minutes per week of exercise  60      Patient Education   Previous Diabetes Education  No    Disease state   Definition of diabetes, type 1 and 2, and the diagnosis of diabetes    Nutrition management   Role of diet in the treatment of diabetes and the relationship between the three main macronutrients and blood glucose level;Food label reading, portion sizes and measuring food.;Meal options for control of blood glucose level and chronic complications.;Information on hints to eating out and maintain blood glucose control.    Physical activity and exercise   Role of exercise on diabetes management, blood pressure control and cardiac health.    Medications  Reviewed patients medication for diabetes, action, purpose, timing of dose and side effects.    Monitoring  Purpose and frequency of SMBG.;Identified appropriate SMBG and/or A1C goals.;Daily foot exams;Other (comment) free style libre option     Chronic complications  Relationship between chronic complications and blood glucose control;Identified and discussed with patient  current chronic complications    Psychosocial adjustment  Worked with patient to identify barriers to care and solutions;Role of stress on diabetes;Helped patient identify a support system for diabetes  management;Identified and addressed patients feelings and concerns about diabetes    Personal strategies to promote health  Lifestyle issues that need to be addressed for better diabetes care      Individualized Goals (developed by patient)   Nutrition  General guidelines for healthy choices and portions discussed    Physical Activity  Exercise 3-5 times per week;30 minutes per day    Monitoring   Not Applicable    Problem Solving  healthier family meals  Reducing Risk  increase portions of healthy fats    Health Coping  discuss diabetes with (comment) MD, RD, CDE      Post-Education Assessment   Patient understands the diabetes disease and treatment process.  Demonstrates understanding / competency    Patient understands incorporating nutritional management into lifestyle.  Demonstrates understanding / competency    Patient undertands incorporating physical activity into lifestyle.  Demonstrates understanding / competency    Patient understands using medications safely.  Demonstrates understanding / competency    Patient understands monitoring blood glucose, interpreting and using results  Demonstrates understanding / competency    Patient understands prevention, detection, and treatment of acute complications.  Demonstrates understanding / competency    Patient understands prevention, detection, and treatment of chronic complications.  Demonstrates understanding / competency    Patient understands how to develop strategies to address psychosocial issues.  Demonstrates understanding / competency    Patient understands how to develop strategies to promote health/change behavior.  Demonstrates understanding / competency      Outcomes   Expected Outcomes  Demonstrated interest in learning. Expect positive outcomes    Future DMSE  PRN    Program Status  Completed       Individualized Plan for Diabetes Self-Management Training:   Learning Objective:  Patient will have a greater  understanding of diabetes self-management. Patient education plan is to attend individual and/or group sessions per assessed needs and concerns.   Plan:   Patient Instructions  Resources:  Choose 1 to start  Lequita Asal, MD ([email or web address]) (Reversing Diabetes)  Darien Ramus, RD (Vegan for Her)  Becoming Vegan  Engine 2 Diet 07/27/17 [employer]- registration required  [email or web address]  [employer]  See list  Be active most days for 30 minutes Consider ways to cook at home more frequently. Increase your nonstarchy vegetables. Reduce added fats.        Expected Outcomes:  Demonstrated interest in learning. Expect positive outcomes  Education material provided: Living Well with Diabetes, Food label handouts, A1C conversion sheet, Meal plan card, My Plate and Snack sheet, Eating healthy the vegetarian way, Plant based primer, vegetarian resources.  Discussed Erythritol as a sugar substitute option., Kidney stone nutritional therapy from AND  If problems or questions, patient to contact team via:  Phone  Future DSME appointment: PRN

## 2017-07-21 NOTE — Patient Instructions (Signed)
Resources:  Choose 1 to start  Lequita AsalNeal Barnard, MD ([email or web address]) (Reversing Diabetes)  Darien RamusVirginia Messina, RD (Vegan for Her)  Becoming Vegan  Engine 2 Diet 07/27/17 Whole Foods- registration required  [email or web address]  [employer] Hom[employer]  See list  Be active most days for 30 minutes Consider ways to cook at home more frequently. Increase your nonstarchy vegetables. Reduce added fats.

## 2019-12-11 ENCOUNTER — Other Ambulatory Visit: Payer: Self-pay

## 2019-12-11 ENCOUNTER — Ambulatory Visit (INDEPENDENT_AMBULATORY_CARE_PROVIDER_SITE_OTHER): Payer: Commercial Managed Care - PPO | Admitting: Podiatry

## 2019-12-11 DIAGNOSIS — L84 Corns and callosities: Secondary | ICD-10-CM

## 2019-12-11 NOTE — Progress Notes (Signed)
Subjective:  Patient ID: Katherine Gay, female    DOB: June 13, 1970,  MRN: [id number] HPI Chief Complaint  Patient presents with  . Foot Problem    Abrasion - L forefoot, medial side. Pt stated, "I slipped in mud on 10/10/19. I fell back, and my shoe straps dug into my skin. Went to PCP 2 wks later and was told that it was infected. Ruled out bone infection with an x-ray and took antibiotics. No more pain or drainage. I just want to know if I'm clear to wear regular shoes and walk again".    49 y.o. female presents with the above complaint.   ROS: Denies fever chills nausea vomiting muscle aches pains calf pain back pain chest pain shortness of breath.  Past Medical History:  Diagnosis Date  . Depression   . HSV-2 infection   . HTN (hypertension)   . Nephrolithiasis    bilateral -- nonobstructive per ct 11-27-2014  . Right ureteral stone   . Type 2 diabetes mellitus (HCC)   . Urinary frequency    Past Surgical History:  Procedure Laterality Date  . CESAREAN SECTION  2004  . CYSTOSCOPY WITH RETROGRADE PYELOGRAM, URETEROSCOPY AND STENT PLACEMENT Right 08/01/2015   Procedure: CYSTOSCOPY WITH RIGHT RETROGRADE PYELOGRAM, URETEROSCOPY AND STENT PLACEMENT;  Surgeon: Crist Fat, MD;  Location: [facility];  Service: Urology;  Laterality: Right;  . HOLMIUM LASER APPLICATION Right 08/01/2015   Procedure: HOLMIUM LASER APPLICATION;  Surgeon: Crist Fat, MD;  Location: [facility];  Service: Urology;  Laterality: Right;  . LAPAROSCOPIC CHOLECYSTECTOMY  08-13-2009  . WISDOM TOOTH EXTRACTION  2011    Current Outpatient Medications:  .  JARDIANCE 10 MG TABS tablet, Take 10 mg by mouth daily., Disp: , Rfl:  .  losartan (COZAAR) 100 MG tablet, Take 100 mg by mouth daily., Disp: , Rfl:  .  metFORMIN (GLUCOPHAGE) 500 MG tablet, Take 1,000 mg by mouth 2 (two) times daily with a meal. , Disp: , Rfl:  .  valACYclovir (VALTREX) 500 MG tablet, Take 1 tablet  (500 mg total) by mouth 2 (two) times daily. (Patient taking differently: Take 500 mg by mouth daily. ), Disp: 60 tablet, Rfl: 11  No Known Allergies Review of Systems Objective:  There were no vitals filed for this visit.  General: Well developed, nourished, in no acute distress, alert and oriented x3   Dermatological: Skin is warm, dry and supple bilateral. Nails x 10 are well maintained; remaining integument appears unremarkable at this time. There are no open sores, no preulcerative lesions, no rash or signs of infection present.  Healed ulceration medial aspect first metatarsophalangeal joint left no open lesions or wounds noted.  Vascular: Dorsalis Pedis artery and Posterior Tibial artery pedal pulses are 2/4 bilateral with immedate capillary fill time. Pedal hair growth present. No varicosities and no lower extremity edema present bilateral.   Neruologic: Grossly intact via light touch bilateral. Vibratory intact via tuning fork bilateral. Protective threshold with Semmes Wienstein monofilament intact to all pedal sites bilateral. Patellar and Achilles deep tendon reflexes 2+ bilateral. No Babinski or clonus noted bilateral.   Musculoskeletal: No gross boney pedal deformities bilateral. No pain, crepitus, or limitation noted with foot and ankle range of motion bilateral. Muscular strength 5/5 in all groups tested bilateral.  Hallux abductovalgus deformities bilateral.  Medial aspect of the first metatarsophalangeal joint left foot demonstrates mild area of erythema where the ulceration has healed completely.  Gait: Unassisted, Nonantalgic.  Radiographs:  Radiographs not taken at patient's request.  Assessment & Plan:   Assessment: Healed ulceration with hallux valgus deformity left.  Plan: Discussed the need for appropriate shoe gear and a yearly follow-up with podiatry.     Rohini Jaroszewski T. [place], [place]

## 2020-02-07 ENCOUNTER — Other Ambulatory Visit: Payer: Self-pay

## 2020-02-07 ENCOUNTER — Other Ambulatory Visit: Payer: Commercial Managed Care - PPO

## 2020-02-07 DIAGNOSIS — Z20822 Contact with and (suspected) exposure to covid-19: Secondary | ICD-10-CM

## 2020-02-09 LAB — NOVEL CORONAVIRUS, NAA: SARS-CoV-2, NAA: NOT DETECTED

## 2020-07-31 ENCOUNTER — Encounter: Payer: Self-pay | Admitting: Dietician

## 2021-02-26 ENCOUNTER — Other Ambulatory Visit: Payer: Self-pay | Admitting: Orthopedic Surgery

## 2021-02-26 DIAGNOSIS — M25511 Pain in right shoulder: Secondary | ICD-10-CM

## 2021-03-11 ENCOUNTER — Other Ambulatory Visit: Payer: Self-pay

## 2021-03-11 ENCOUNTER — Ambulatory Visit
Admission: RE | Admit: 2021-03-11 | Discharge: 2021-03-11 | Disposition: A | Discharge status: Home / Self Care | Payer: Commercial Managed Care - PPO | Source: Ambulatory Visit | Attending: Orthopedic Surgery | Admitting: Orthopedic Surgery

## 2021-03-11 DIAGNOSIS — M25511 Pain in right shoulder: Secondary | ICD-10-CM

## 2021-05-17 ENCOUNTER — Telehealth: Payer: Self-pay | Admitting: Gastroenterology

## 2021-05-17 DIAGNOSIS — R112 Nausea with vomiting, unspecified: Secondary | ICD-10-CM

## 2021-05-17 MED ORDER — ONDANSETRON 4 MG PO TBDP: 4.0000 mg | ORAL_TABLET | ORAL | Status: DC | PRN

## 2021-05-17 NOTE — Telephone Encounter (Signed)
Called by patient this evening. She is in preparation for her 1030 colonoscopy with Dr. Mann/Dr. Elnoria Howard on 12/12. Patient is undergoing their fourth attempt at colonoscopy preparation. She started taking doses of MiraLAX on 12/7 and continued that into today.  Unfortunately, her large dose of MiraLAX (238 g) was taken down but she has vomited that up. She has some MiraLAX preparation still left and she has Dulcolax. I offered to send the patient antiemetics (Zofran 4 mg every 6 hours as needed). This is in an effort of trying to get the patient to be able to tolerate some further preparation. She is not sure that she could do a large dose of MiraLAX that she had previously and there is a Scientist, clinical (histocompatibility and immunogenetics) of magnesium citrate.  The patient is going to take 10 mg of Dulcolax now. In 1 to 2 hours she will take Zofran 4 mg. In an additional 2 hours from there she will take another 10 mg of oral Dulcolax. Around 330/4 AM, the patient will initiate the rest of the MiraLAX preparation that she has while also taking another dose of 4 mg of Zofran 30 minutes before initiation of that. She will continue to take fluids (clear liquid) in an effort of trying to clear her out. She will stop all clear liquids by 7:30 AM. I will forward this information to the patient's primary gastroenterology team.  Corliss Parish, MD [facility] Advanced Endoscopy Office # [phone]

## 2021-09-15 ENCOUNTER — Ambulatory Visit: Payer: Commercial Managed Care - PPO | Admitting: Podiatry

## 2021-10-07 LAB — COLOGUARD: COLOGUARD: NEGATIVE

## 2021-10-14 ENCOUNTER — Emergency Department (HOSPITAL_COMMUNITY): Payer: Commercial Managed Care - PPO

## 2021-10-14 ENCOUNTER — Emergency Department (HOSPITAL_COMMUNITY)
Admission: EM | Admit: 2021-10-14 | Discharge: 2021-10-14 | Disposition: A | Discharge status: Home / Self Care | Payer: Commercial Managed Care - PPO | Source: Home / Self Care | Attending: Emergency Medicine | Admitting: Emergency Medicine

## 2021-10-14 ENCOUNTER — Other Ambulatory Visit (HOSPITAL_COMMUNITY): Payer: Self-pay

## 2021-10-14 ENCOUNTER — Encounter (HOSPITAL_COMMUNITY): Payer: Self-pay

## 2021-10-14 ENCOUNTER — Other Ambulatory Visit: Payer: Self-pay

## 2021-10-14 DIAGNOSIS — N200 Calculus of kidney: Secondary | ICD-10-CM

## 2021-10-14 DIAGNOSIS — N202 Calculus of kidney with calculus of ureter: Secondary | ICD-10-CM | POA: Insufficient documentation

## 2021-10-14 DIAGNOSIS — Z7984 Long term (current) use of oral hypoglycemic drugs: Secondary | ICD-10-CM | POA: Insufficient documentation

## 2021-10-14 DIAGNOSIS — E1165 Type 2 diabetes mellitus with hyperglycemia: Secondary | ICD-10-CM | POA: Insufficient documentation

## 2021-10-14 DIAGNOSIS — D72829 Elevated white blood cell count, unspecified: Secondary | ICD-10-CM | POA: Insufficient documentation

## 2021-10-14 DIAGNOSIS — N201 Calculus of ureter: Secondary | ICD-10-CM | POA: Diagnosis not present

## 2021-10-14 DIAGNOSIS — A4151 Sepsis due to Escherichia coli [E. coli]: Secondary | ICD-10-CM | POA: Diagnosis not present

## 2021-10-14 LAB — CBC WITH DIFFERENTIAL/PLATELET
Abs Immature Granulocytes: 0.08 10*3/uL — ABNORMAL HIGH (ref 0.00–0.07)
Basophils Absolute: 0.1 10*3/uL (ref 0.0–0.1)
Basophils Relative: 1 %
Eosinophils Absolute: 0.2 10*3/uL (ref 0.0–0.5)
Eosinophils Relative: 1 %
HCT: 40.2 % (ref 36.0–46.0)
Hemoglobin: 13.4 g/dL (ref 12.0–15.0)
Immature Granulocytes: 1 %
Lymphocytes Relative: 8 %
Lymphs Abs: 1.2 10*3/uL (ref 0.7–4.0)
MCH: 29.9 pg (ref 26.0–34.0)
MCHC: 33.3 g/dL (ref 30.0–36.0)
MCV: 89.7 fL (ref 80.0–100.0)
Monocytes Absolute: 0.5 10*3/uL (ref 0.1–1.0)
Monocytes Relative: 4 %
Neutro Abs: 13.1 10*3/uL — ABNORMAL HIGH (ref 1.7–7.7)
Neutrophils Relative %: 85 %
Platelets: 435 10*3/uL — ABNORMAL HIGH (ref 150–400)
RBC: 4.48 MIL/uL (ref 3.87–5.11)
RDW: 13.2 % (ref 11.5–15.5)
WBC: 15.2 10*3/uL — ABNORMAL HIGH (ref 4.0–10.5)
nRBC: 0 % (ref 0.0–0.2)

## 2021-10-14 LAB — COMPREHENSIVE METABOLIC PANEL
ALT: 18 U/L (ref 0–44)
AST: 15 U/L (ref 15–41)
Albumin: 3.6 g/dL (ref 3.5–5.0)
Alkaline Phosphatase: 67 U/L (ref 38–126)
Anion gap: 7 (ref 5–15)
BUN: 12 mg/dL (ref 6–20)
CO2: 25 mmol/L (ref 22–32)
Calcium: 9.6 mg/dL (ref 8.9–10.3)
Chloride: 103 mmol/L (ref 98–111)
Creatinine, Ser: 0.8 mg/dL (ref 0.44–1.00)
GFR, Estimated: >60 mL/min (ref >60)
Glucose, Bld: 311 mg/dL — ABNORMAL HIGH (ref 70–99)
Potassium: 4.9 mmol/L (ref 3.5–5.1)
Sodium: 135 mmol/L (ref 135–145)
Total Bilirubin: 0.6 mg/dL (ref 0.3–1.2)
Total Protein: 7.2 g/dL (ref 6.5–8.1)

## 2021-10-14 LAB — URINALYSIS, COMPLETE (UACMP) WITH MICROSCOPIC
Bilirubin Urine: NEGATIVE
Glucose, UA: 150 mg/dL — AB
Hgb urine dipstick: NEGATIVE
Ketones, ur: NEGATIVE mg/dL
Nitrite: NEGATIVE
Protein, ur: NEGATIVE mg/dL
Specific Gravity, Urine: 1.02 (ref 1.005–1.030)
pH: 5 (ref 5.0–8.0)

## 2021-10-14 LAB — PREGNANCY, URINE: Preg Test, Ur: NEGATIVE

## 2021-10-14 LAB — LIPASE, BLOOD: Lipase: 44 U/L (ref 11–51)

## 2021-10-14 MED ORDER — ONDANSETRON HCL 4 MG PO TABS: 4.0000 mg | ORAL_TABLET | Freq: Three times a day (TID) | ORAL | 0 refills | Status: DC | PRN

## 2021-10-14 MED ORDER — NORTRIPTYLINE HCL 50 MG PO CAPS
100.0000 mg | ORAL_CAPSULE | Freq: Every day | ORAL | 0 refills | Status: DC
  Filled 2021-10-14: qty 30, 15d supply, fill #0

## 2021-10-14 MED ORDER — SODIUM CHLORIDE 0.9 % IV BOLUS
1000.0000 mL | Freq: Once | INTRAVENOUS | Status: AC
  Administered 2021-10-14: 1000 mL via INTRAVENOUS

## 2021-10-14 MED ORDER — TAMSULOSIN HCL 0.4 MG PO CAPS: 0.4000 mg | ORAL_CAPSULE | Freq: Every day | ORAL | 0 refills | Status: DC

## 2021-10-14 MED ORDER — ONDANSETRON HCL 4 MG/2ML IJ SOLN
4.0000 mg | Freq: Once | INTRAMUSCULAR | Status: AC
  Administered 2021-10-14: 4 mg via INTRAVENOUS
  Filled 2021-10-14: qty 2

## 2021-10-14 MED ORDER — HYDROMORPHONE HCL 1 MG/ML IJ SOLN
1.0000 mg | Freq: Once | INTRAMUSCULAR | Status: DC
  Filled 2021-10-14: qty 1

## 2021-10-14 MED ORDER — KETOROLAC TROMETHAMINE 30 MG/ML IJ SOLN
30.0000 mg | Freq: Once | INTRAMUSCULAR | Status: AC
  Administered 2021-10-14: 30 mg via INTRAVENOUS
  Filled 2021-10-14: qty 1

## 2021-10-14 MED ORDER — HYDROCODONE-ACETAMINOPHEN 5-325 MG PO TABS: 1.0000 | ORAL_TABLET | Freq: Four times a day (QID) | ORAL | 0 refills | Status: DC | PRN

## 2021-10-14 NOTE — ED Provider Notes (Signed)
?[facility] ?Provider Note ? ? ?CSN: [id number] ?Arrival date & time: 10/14/21  0405 ? ?  ? ?History ? ?Chief Complaint  ?Patient presents with  ? Emesis  ? ? ?Katherine Gay is a 51 y.o. female with history of depression, nephrolithiasis, type 2 diabetes.  The patient presents ED for evaluation of right-sided flank pain that she been experiencing last night at 11 PM.  Patient reports that since December, she has had foul-smelling urine.  The patient reports that 3 weeks ago she finally went to her PCP and was placed on antibiotics for suspected UTI for 3 days. Patient unable to tell me the name of the medication she was placed on at this time.  Patient reports that 3 days ago she tried to go back to her PCP and get more antibiotics for continued foul-smelling urine however was told she could not, patient unable to elaborate on why she was not able to receive more antibiotic medication.  Patient states that last night was woken with sudden onset right-sided flank pain.  The patient reports that she had an episode of similar pain 1 week prior however it resolved on its own.  Patient endorsing right-sided flank pain, burning with urination, foul-smelling urine, nausea, vomiting.  Patient denies any fevers, body aches or chills, urinary frequency, trouble urinating, vaginal discharge. ? ? ?Emesis ?Associated symptoms: no chills and no fever   ? ?  ? ?Home Medications ?Prior to Admission medications   ?Medication Sig Start Date End Date Taking? Authorizing Provider  ?HYDROcodone-acetaminophen (NORCO/VICODIN) 5-325 MG tablet Take 1 tablet by mouth every 6 (six) hours as needed for severe pain. 10/14/21  Yes Azucena Cecil, PA-C  ?ondansetron (ZOFRAN) 4 MG tablet Take 1 tablet (4 mg total) by mouth every 8 (eight) hours as needed for nausea or vomiting. 10/14/21  Yes Azucena Cecil, PA-C  ?tamsulosin (FLOMAX) 0.4 MG CAPS capsule Take 1 capsule (0.4 mg total) by mouth daily  after breakfast. 10/14/21  Yes Azucena Cecil, PA-C  ?JARDIANCE 10 MG TABS tablet Take 10 mg by mouth daily. 09/11/19   [provider]  ?losartan (COZAAR) 100 MG tablet Take 100 mg by mouth daily.    [provider]  ?metFORMIN (GLUCOPHAGE) 500 MG tablet Take 1,000 mg by mouth 2 (two) times daily with a meal.     [provider]  ?valACYclovir (VALTREX) 500 MG tablet Take 1 tablet (500 mg total) by mouth 2 (two) times daily. ?Patient taking differently: Take 500 mg by mouth daily.  04/13/12   Delsa Bern, MD  ?   ? ?Allergies    ?Patient has no known allergies.   ? ?Review of Systems   ?Review of Systems  ?Constitutional:  Negative for chills and fever.  ?Gastrointestinal:  Positive for nausea and vomiting.  ?Genitourinary:  Positive for dysuria and flank pain. Negative for difficulty urinating and vaginal discharge.  ?All other systems reviewed and are negative. ? ?Physical Exam ?Updated Vital Signs ?BP (!) 149/98   Pulse 89   Temp 98.3 ?F (36.8 ?C) (Oral)   Resp 16   SpO2 98%  ?Physical Exam ?Vitals and nursing note reviewed.  ?Constitutional:   ?   General: She is not in acute distress. ?   Appearance: Normal appearance. She is not ill-appearing, toxic-appearing or diaphoretic.  ?HENT:  ?   Head: Normocephalic and atraumatic.  ?   Nose: Nose normal. No congestion.  ?   Mouth/Throat:  ?  Mouth: Mucous membranes are moist.  ?   Pharynx: Oropharynx is clear.  ?Eyes:  ?   Extraocular Movements: Extraocular movements intact.  ?   Conjunctiva/sclera: Conjunctivae normal.  ?   Pupils: Pupils are equal, round, and reactive to light.  ?Cardiovascular:  ?   Rate and Rhythm: Normal rate and regular rhythm.  ?Pulmonary:  ?   Effort: Pulmonary effort is normal.  ?   Breath sounds: Normal breath sounds. No wheezing.  ?Abdominal:  ?   General: Abdomen is flat. Bowel sounds are normal.  ?   Palpations: Abdomen is soft.  ?   Tenderness: There is no abdominal tenderness. There is right CVA  tenderness. There is no left CVA tenderness.  ?Musculoskeletal:  ?   Cervical back: Normal range of motion and neck supple. No tenderness.  ?Skin: ?   General: Skin is warm and dry.  ?   Capillary Refill: Capillary refill takes less than 2 seconds.  ?Neurological:  ?   Mental Status: She is alert and oriented to person, place, and time.  ? ? ?ED Results / Procedures / Treatments   ?Labs ?(all labs ordered are listed, but only abnormal results are displayed) ?Labs Reviewed  ?CBC WITH DIFFERENTIAL/PLATELET - Abnormal; Notable for the following components:  ?    Result Value  ? WBC 15.2 (*)   ? Platelets 435 (*)   ? Neutro Abs 13.1 (*)   ? Abs Immature Granulocytes 0.08 (*)   ? All other components within normal limits  ?COMPREHENSIVE METABOLIC PANEL - Abnormal; Notable for the following components:  ? Glucose, Bld 311 (*)   ? All other components within normal limits  ?URINALYSIS, COMPLETE (UACMP) WITH MICROSCOPIC - Abnormal; Notable for the following components:  ? APPearance HAZY (*)   ? Glucose, UA 150 (*)   ? Leukocytes,Ua SMALL (*)   ? Bacteria, UA MANY (*)   ? All other components within normal limits  ?URINE CULTURE  ?LIPASE, BLOOD  ?PREGNANCY, URINE  ? ? ?EKG ?None ? ?Radiology ?CT Renal Stone Study ? ?Result Date: 10/14/2021 ?CLINICAL DATA:  Flank pain with kidney stone suspected EXAM: CT ABDOMEN AND PELVIS WITHOUT CONTRAST TECHNIQUE: Multidetector CT imaging of the abdomen and pelvis was performed following the standard protocol without IV contrast. RADIATION DOSE REDUCTION: This exam was performed according to the departmental dose-optimization program which includes automated exposure control, adjustment of the mA and/or kV according to patient size and/or use of iterative reconstruction technique. COMPARISON:  11/27/2014 FINDINGS: Lower chest:  No contributory findings. Hepatobiliary: No focal liver abnormality.Cholecystectomy. No bile duct dilatation Pancreas: Unremarkable. Spleen: Unremarkable.  Adrenals/Urinary Tract: Negative adrenals. Right hydroureteronephrosis due to a 6 x 4 mm stone at the right UVJ. Right lower pole calculus measuring 7 mm. Unremarkable bladder. Stomach/Bowel:  No obstruction. No appendicitis. Vascular/Lymphatic: No acute vascular abnormality. No mass or adenopathy. Reproductive:No pathologic findings. Other: No ascites or pneumoperitoneum. Musculoskeletal: Spinal degeneration without acute abnormalities. IMPRESSION: 1. Obstructing 6 x 4 mm stone at the right UVJ. 2. 7 mm right renal calculus. Electronically Signed   By: Jorje Guild M.D.   On: 10/14/2021 05:27   ? ?Procedures ?Procedures  ? ? ?Medications Ordered in ED ?Medications  ?HYDROmorphone (DILAUDID) injection 1 mg (has no administration in time range)  ?sodium chloride 0.9 % bolus 1,000 mL ( Intravenous Stopped 10/14/21 0918)  ?ketorolac (TORADOL) 30 MG/ML injection 30 mg (30 mg Intravenous Given 10/14/21 0802)  ?ondansetron (ZOFRAN) injection 4 mg (4 mg Intravenous Given  10/14/21 0802)  ? ? ?ED Course/ Medical Decision Making/ A&P ?  ?                        ?Medical Decision Making ?Risk ?Prescription drug management. ? ? ?51 year old female presents ED for evaluation of back pain, foul-smelling urine and emesis.  Please see HPI for further details of the event. ? ?On examination, the patient looks uncomfortable.  She is afebrile, nontachycardic.  The patient is nonhypoxic on room air with clear lungs bilaterally.  The patient's abdomen is soft compressible all 4 quadrants.  The patient does endorse right-sided CVA tenderness.  Patient nontoxic in appearance. ? ?Patient worked up utilizing following labs imaging studies interpreted by me personally: ?- CBC with elevated white blood cell count of 15.2, however patient afebrile.  Discussed with my attending placing the patient on antibiotics however at this time we feel that this is not necessary. ?- CMP shows elevated glucose to 311, patient states she has not taken her  diabetic medication today ?- Lipase unremarkable ?- Urinalysis shows no nitrites however does show leukocytes ?- Negative pregnancy test ?- Urine to be cultured ?- CT renal stone study shows a 6 x 4 mm obstructing stone

## 2021-10-14 NOTE — Discharge Instructions (Addendum)
Return to the ED with any new signs or symptoms such as fevers recorded with thermometer, body aches or chills, increased back pain, painful urination ?Please follow-up with the urologist that you saw for your previous kidney stone.  Please call and make an appointment to be seen. ?Please pick up medication I have sent in for you.  You will need to take the tamsulosin 1 time daily to assist in the passage of the stone.  You can take the Zofran for nausea as needed every 6 hours.  He may take the hydrocodone for pain every 8 hours as needed.  Please do not drive or operate heavy machinery while under the influence of pain medication. ?Pain medication tends to constipate people.  You may want to pick up some MiraLAX or Metamucil at the drugstore. ?Please continue to push fluids and remain hydrated.  Peeing will assist in the passage of the stone. ?Please read the attached informational guide concerning dietary guidelines to prevent kidney stones as well as the informational guidelines concerning kidney stones in general. ?

## 2021-10-14 NOTE — ED Provider Triage Note (Signed)
Emergency Medicine Provider Triage Evaluation Note ? ?Katherine Gay , a 51 y.o. female  was evaluated in triage.  Pt complains of foul-smelling urine, right flank pain with nausea and vomiting starting 1:00 this morning.  She denies any diarrhea or constipation.  Denies any hematuria, dysuria, hematochezia, or melena she denies any fever.  She reports that she is up-to-date distant history of kidney stones and this feels like her last 1.. ? ?Review of Systems  ?Positive:  ?Negative:  ? ?Physical Exam  ?There were no vitals taken for this visit. ?Gen:   Awake, no distress   ?Resp:  Normal effort  ?MSK:   Moves extremities without difficulty  ?Other:  Abdomen soft, nontender.  Positive right CVA tenderness. ? ?Medical Decision Making  ?Medically screening exam initiated at 4:38 AM.  Appropriate orders placed.  Katherine Gay was informed that the remainder of the evaluation will be completed by another provider, this initial triage assessment does not replace that evaluation, and the importance of remaining in the ED until their evaluation is complete. ? ?We will order labs, urine, and CT renal ?  ?Achille Rich, PA-C ?10/14/21 0440 ? ?

## 2021-10-14 NOTE — ED Triage Notes (Signed)
Pt complains of foul-smelling urine, right flank pain with nausea and vomiting starting 1:00 this morning.  She denies any diarrhea or constipation.  ?

## 2021-10-16 ENCOUNTER — Inpatient Hospital Stay (HOSPITAL_COMMUNITY)
Admission: EM | Admit: 2021-10-16 | Discharge: 2021-10-19 | DRG: 853 | Disposition: A | Discharge status: Home / Self Care | Payer: Commercial Managed Care - PPO | Attending: Family Medicine | Admitting: Family Medicine

## 2021-10-16 ENCOUNTER — Inpatient Hospital Stay (HOSPITAL_COMMUNITY): Payer: Commercial Managed Care - PPO | Admitting: Anesthesiology

## 2021-10-16 ENCOUNTER — Encounter (HOSPITAL_COMMUNITY)
Admission: EM | Disposition: A | Discharge status: Home / Self Care | Payer: Self-pay | Source: Home / Self Care | Attending: Family Medicine

## 2021-10-16 ENCOUNTER — Emergency Department (HOSPITAL_COMMUNITY): Payer: Commercial Managed Care - PPO

## 2021-10-16 ENCOUNTER — Other Ambulatory Visit: Payer: Self-pay

## 2021-10-16 ENCOUNTER — Inpatient Hospital Stay (HOSPITAL_COMMUNITY): Payer: Commercial Managed Care - PPO

## 2021-10-16 ENCOUNTER — Encounter (HOSPITAL_COMMUNITY): Payer: Self-pay

## 2021-10-16 DIAGNOSIS — E1110 Type 2 diabetes mellitus with ketoacidosis without coma: Secondary | ICD-10-CM | POA: Diagnosis present

## 2021-10-16 DIAGNOSIS — Z7984 Long term (current) use of oral hypoglycemic drugs: Secondary | ICD-10-CM

## 2021-10-16 DIAGNOSIS — E1100 Type 2 diabetes mellitus with hyperosmolarity without nonketotic hyperglycemic-hyperosmolar coma (NKHHC): Secondary | ICD-10-CM

## 2021-10-16 DIAGNOSIS — N136 Pyonephrosis: Secondary | ICD-10-CM | POA: Diagnosis present

## 2021-10-16 DIAGNOSIS — A419 Sepsis, unspecified organism: Secondary | ICD-10-CM | POA: Diagnosis present

## 2021-10-16 DIAGNOSIS — Z79899 Other long term (current) drug therapy: Secondary | ICD-10-CM

## 2021-10-16 DIAGNOSIS — I10 Essential (primary) hypertension: Secondary | ICD-10-CM

## 2021-10-16 DIAGNOSIS — E1165 Type 2 diabetes mellitus with hyperglycemia: Secondary | ICD-10-CM

## 2021-10-16 DIAGNOSIS — R579 Shock, unspecified: Secondary | ICD-10-CM

## 2021-10-16 DIAGNOSIS — E119 Type 2 diabetes mellitus without complications: Secondary | ICD-10-CM | POA: Diagnosis not present

## 2021-10-16 DIAGNOSIS — E871 Hypo-osmolality and hyponatremia: Secondary | ICD-10-CM | POA: Diagnosis present

## 2021-10-16 DIAGNOSIS — N179 Acute kidney failure, unspecified: Secondary | ICD-10-CM | POA: Diagnosis present

## 2021-10-16 DIAGNOSIS — A4151 Sepsis due to Escherichia coli [E. coli]: Principal | ICD-10-CM | POA: Diagnosis present

## 2021-10-16 DIAGNOSIS — R6520 Severe sepsis without septic shock: Secondary | ICD-10-CM

## 2021-10-16 DIAGNOSIS — R6521 Severe sepsis with septic shock: Secondary | ICD-10-CM | POA: Diagnosis present

## 2021-10-16 DIAGNOSIS — Z803 Family history of malignant neoplasm of breast: Secondary | ICD-10-CM

## 2021-10-16 DIAGNOSIS — N201 Calculus of ureter: Secondary | ICD-10-CM | POA: Diagnosis present

## 2021-10-16 DIAGNOSIS — Z20822 Contact with and (suspected) exposure to covid-19: Secondary | ICD-10-CM | POA: Diagnosis present

## 2021-10-16 DIAGNOSIS — A411 Sepsis due to other specified staphylococcus: Secondary | ICD-10-CM | POA: Diagnosis present

## 2021-10-16 HISTORY — PX: CYSTOSCOPY W/ URETERAL STENT PLACEMENT: SHX1429

## 2021-10-16 LAB — URINALYSIS, ROUTINE W REFLEX MICROSCOPIC
Bilirubin Urine: NEGATIVE
Glucose, UA: >=500 mg/dL — AB
Ketones, ur: 5 mg/dL — AB
Nitrite: NEGATIVE
Protein, ur: 100 mg/dL — AB
Specific Gravity, Urine: 1.015 (ref 1.005–1.030)
pH: 5 (ref 5.0–8.0)

## 2021-10-16 LAB — COMPREHENSIVE METABOLIC PANEL
ALT: 46 U/L — ABNORMAL HIGH (ref 0–44)
ALT: 40 U/L (ref 0–44)
AST: 44 U/L — ABNORMAL HIGH (ref 15–41)
AST: 48 U/L — ABNORMAL HIGH (ref 15–41)
Albumin: 3.2 g/dL — ABNORMAL LOW (ref 3.5–5.0)
Albumin: 2.7 g/dL — ABNORMAL LOW (ref 3.5–5.0)
Alkaline Phosphatase: 69 U/L (ref 38–126)
Alkaline Phosphatase: 53 U/L (ref 38–126)
Anion gap: 14 (ref 5–15)
Anion gap: 10 (ref 5–15)
BUN: 52 mg/dL — ABNORMAL HIGH (ref 6–20)
BUN: 53 mg/dL — ABNORMAL HIGH (ref 6–20)
CO2: 19 mmol/L — ABNORMAL LOW (ref 22–32)
CO2: 21 mmol/L — ABNORMAL LOW (ref 22–32)
Calcium: 8 mg/dL — ABNORMAL LOW (ref 8.9–10.3)
Calcium: 7.7 mg/dL — ABNORMAL LOW (ref 8.9–10.3)
Chloride: 90 mmol/L — ABNORMAL LOW (ref 98–111)
Chloride: 99 mmol/L (ref 98–111)
Creatinine, Ser: 2.86 mg/dL — ABNORMAL HIGH (ref 0.44–1.00)
Creatinine, Ser: 2.61 mg/dL — ABNORMAL HIGH (ref 0.44–1.00)
GFR, Estimated: 19 mL/min — ABNORMAL LOW (ref >60)
GFR, Estimated: 21 mL/min — ABNORMAL LOW (ref >60)
Glucose, Bld: 527 mg/dL (ref 70–99)
Glucose, Bld: 272 mg/dL — ABNORMAL HIGH (ref 70–99)
Potassium: 4.8 mmol/L (ref 3.5–5.1)
Potassium: 4.2 mmol/L (ref 3.5–5.1)
Sodium: 123 mmol/L — ABNORMAL LOW (ref 135–145)
Sodium: 130 mmol/L — ABNORMAL LOW (ref 135–145)
Total Bilirubin: 1.2 mg/dL (ref 0.3–1.2)
Total Bilirubin: 1.1 mg/dL (ref 0.3–1.2)
Total Protein: 7.1 g/dL (ref 6.5–8.1)
Total Protein: 5.7 g/dL — ABNORMAL LOW (ref 6.5–8.1)

## 2021-10-16 LAB — RESP PANEL BY RT-PCR (FLU A&B, COVID) ARPGX2
Influenza A by PCR: NEGATIVE
Influenza B by PCR: NEGATIVE
SARS Coronavirus 2 by RT PCR: NEGATIVE

## 2021-10-16 LAB — GLUCOSE, CAPILLARY
Glucose-Capillary: 340 mg/dL — ABNORMAL HIGH (ref 70–99)
Glucose-Capillary: 328 mg/dL — ABNORMAL HIGH (ref 70–99)
Glucose-Capillary: 263 mg/dL — ABNORMAL HIGH (ref 70–99)
Glucose-Capillary: 276 mg/dL — ABNORMAL HIGH (ref 70–99)
Glucose-Capillary: 353 mg/dL — ABNORMAL HIGH (ref 70–99)

## 2021-10-16 LAB — CBC WITH DIFFERENTIAL/PLATELET
Abs Immature Granulocytes: 0.25 10*3/uL — ABNORMAL HIGH (ref 0.00–0.07)
Basophils Absolute: 0 10*3/uL (ref 0.0–0.1)
Basophils Relative: 0 %
Eosinophils Absolute: 0 10*3/uL (ref 0.0–0.5)
Eosinophils Relative: 0 %
HCT: 35.8 % — ABNORMAL LOW (ref 36.0–46.0)
Hemoglobin: 12.3 g/dL (ref 12.0–15.0)
Immature Granulocytes: 3 %
Lymphocytes Relative: 4 %
Lymphs Abs: 0.4 10*3/uL — ABNORMAL LOW (ref 0.7–4.0)
MCH: 29.9 pg (ref 26.0–34.0)
MCHC: 34.4 g/dL (ref 30.0–36.0)
MCV: 87.1 fL (ref 80.0–100.0)
Monocytes Absolute: 0.5 10*3/uL (ref 0.1–1.0)
Monocytes Relative: 5 %
Neutro Abs: 8.9 10*3/uL — ABNORMAL HIGH (ref 1.7–7.7)
Neutrophils Relative %: 88 %
Platelets: 181 10*3/uL (ref 150–400)
RBC: 4.11 MIL/uL (ref 3.87–5.11)
RDW: 13.3 % (ref 11.5–15.5)
WBC: 10.1 10*3/uL (ref 4.0–10.5)
nRBC: 0 % (ref 0.0–0.2)

## 2021-10-16 LAB — HEMOGLOBIN A1C
Hgb A1c MFr Bld: 9.6 % — ABNORMAL HIGH (ref 4.8–5.6)
Mean Plasma Glucose: 228.82 mg/dL

## 2021-10-16 LAB — LACTIC ACID, PLASMA
Lactic Acid, Venous: 4 mmol/L (ref 0.5–1.9)
Lactic Acid, Venous: 7.4 mmol/L (ref 0.5–1.9)
Lactic Acid, Venous: 2.9 mmol/L (ref 0.5–1.9)
Lactic Acid, Venous: 2.2 mmol/L (ref 0.5–1.9)

## 2021-10-16 LAB — BLOOD GAS, ARTERIAL
Acid-base deficit: 7.5 mmol/L — ABNORMAL HIGH (ref 0.0–2.0)
Bicarbonate: 15.5 mmol/L — ABNORMAL LOW (ref 20.0–28.0)
O2 Saturation: 97.6 %
Patient temperature: 37
pCO2 arterial: 25 mmHg — ABNORMAL LOW (ref 32–48)
pH, Arterial: 7.4 (ref 7.35–7.45)
pO2, Arterial: 77 mmHg — ABNORMAL LOW (ref 83–108)

## 2021-10-16 LAB — CBG MONITORING, ED: Glucose-Capillary: 402 mg/dL — ABNORMAL HIGH (ref 70–99)

## 2021-10-16 LAB — I-STAT BETA HCG BLOOD, ED (MC, WL, AP ONLY): I-stat hCG, quantitative: <5 m[IU]/mL (ref <5)

## 2021-10-16 LAB — HIV ANTIBODY (ROUTINE TESTING W REFLEX): HIV Screen 4th Generation wRfx: NONREACTIVE

## 2021-10-16 LAB — PROTIME-INR
INR: 1.2 (ref 0.8–1.2)
Prothrombin Time: 14.9 seconds (ref 11.4–15.2)

## 2021-10-16 LAB — OSMOLALITY: Osmolality: 290 mOsm/kg (ref 275–295)

## 2021-10-16 SURGERY — CYSTOSCOPY, WITH RETROGRADE PYELOGRAM AND URETERAL STENT INSERTION
Anesthesia: Monitor Anesthesia Care | Site: Ureter | Laterality: Right

## 2021-10-16 MED ORDER — FENTANYL CITRATE PF 50 MCG/ML IJ SOSY
50.0000 ug | PREFILLED_SYRINGE | Freq: Once | INTRAMUSCULAR | Status: AC
  Administered 2021-10-16: 50 ug via INTRAVENOUS
  Filled 2021-10-16: qty 1

## 2021-10-16 MED ORDER — SODIUM CHLORIDE 0.9 % IV SOLN
250.0000 mL | INTRAVENOUS | Status: DC
  Administered 2021-10-16: 250 mL via INTRAVENOUS

## 2021-10-16 MED ORDER — TAMSULOSIN HCL 0.4 MG PO CAPS
0.4000 mg | ORAL_CAPSULE | Freq: Every day | ORAL | Status: DC
  Administered 2021-10-17 – 2021-10-19 (×3): 0.4 mg via ORAL
  Filled 2021-10-16 (×3): qty 1

## 2021-10-16 MED ORDER — FENTANYL CITRATE PF 50 MCG/ML IJ SOSY: 25.0000 ug | PREFILLED_SYRINGE | INTRAMUSCULAR | Status: DC | PRN

## 2021-10-16 MED ORDER — PROCHLORPERAZINE EDISYLATE 10 MG/2ML IJ SOLN
10.0000 mg | Freq: Four times a day (QID) | INTRAMUSCULAR | Status: DC | PRN
  Administered 2021-10-17: 10 mg via INTRAVENOUS
  Filled 2021-10-16: qty 2

## 2021-10-16 MED ORDER — SODIUM CHLORIDE 0.9 % IV SOLN
2.0000 g | Freq: Once | INTRAVENOUS | Status: AC
  Administered 2021-10-16: 2 g via INTRAVENOUS
  Filled 2021-10-16: qty 12.5

## 2021-10-16 MED ORDER — DEXTROSE IN LACTATED RINGERS 5 % IV SOLN: INTRAVENOUS | Status: DC

## 2021-10-16 MED ORDER — INSULIN ASPART 100 UNIT/ML IJ SOLN
5.0000 [IU] | Freq: Once | INTRAMUSCULAR | Status: AC
  Administered 2021-10-16: 5 [IU] via SUBCUTANEOUS

## 2021-10-16 MED ORDER — ALBUMIN HUMAN 5 % IV SOLN
12.5000 g | Freq: Once | INTRAVENOUS | Status: AC
  Administered 2021-10-16: 12.5 g via INTRAVENOUS

## 2021-10-16 MED ORDER — ONDANSETRON HCL 4 MG/2ML IJ SOLN: 4.0000 mg | Freq: Once | INTRAMUSCULAR | Status: DC | PRN

## 2021-10-16 MED ORDER — INSULIN ASPART 100 UNIT/ML IJ SOLN
10.0000 [IU] | Freq: Once | INTRAMUSCULAR | Status: AC
  Administered 2021-10-16: 10 [IU] via SUBCUTANEOUS
  Filled 2021-10-16: qty 1

## 2021-10-16 MED ORDER — STERILE WATER FOR IRRIGATION IR SOLN
Status: DC | PRN
Start: 2021-10-16 — End: 2021-10-16
  Administered 2021-10-16: 3000 mL

## 2021-10-16 MED ORDER — AMISULPRIDE (ANTIEMETIC) 5 MG/2ML IV SOLN: 10.0000 mg | Freq: Once | INTRAVENOUS | Status: DC | PRN

## 2021-10-16 MED ORDER — CHLORHEXIDINE GLUCONATE CLOTH 2 % EX PADS
6.0000 | MEDICATED_PAD | Freq: Every day | CUTANEOUS | Status: DC
  Administered 2021-10-17: 6 via TOPICAL

## 2021-10-16 MED ORDER — SODIUM CHLORIDE 0.9 % IV SOLN: 2.0000 g | INTRAVENOUS | Status: DC

## 2021-10-16 MED ORDER — ACETAMINOPHEN 500 MG PO TABS
1000.0000 mg | ORAL_TABLET | Freq: Once | ORAL | Status: AC
  Administered 2021-10-16: 1000 mg via ORAL
  Filled 2021-10-16: qty 2

## 2021-10-16 MED ORDER — LACTATED RINGERS IV SOLN: INTRAVENOUS | Status: DC

## 2021-10-16 MED ORDER — INSULIN REGULAR(HUMAN) IN NACL 100-0.9 UT/100ML-% IV SOLN: INTRAVENOUS | Status: DC

## 2021-10-16 MED ORDER — DEXTROSE 50 % IV SOLN: 0.0000 mL | INTRAVENOUS | Status: DC | PRN

## 2021-10-16 MED ORDER — INSULIN ASPART 100 UNIT/ML IJ SOLN
INTRAMUSCULAR | Status: AC
  Administered 2021-10-16: 5 [IU] via SUBCUTANEOUS
  Filled 2021-10-16: qty 1

## 2021-10-16 MED ORDER — LACTATED RINGERS IV BOLUS
2000.0000 mL | Freq: Once | INTRAVENOUS | Status: AC
  Administered 2021-10-16: 2000 mL via INTRAVENOUS

## 2021-10-16 MED ORDER — DEXAMETHASONE SODIUM PHOSPHATE 10 MG/ML IJ SOLN
INTRAMUSCULAR | Status: DC | PRN
  Administered 2021-10-16: 10 mg via INTRAVENOUS

## 2021-10-16 MED ORDER — PHENYLEPHRINE HCL (PRESSORS) 10 MG/ML IV SOLN
INTRAVENOUS | Status: AC
  Filled 2021-10-16: qty 1

## 2021-10-16 MED ORDER — SODIUM CHLORIDE 0.9 % IV BOLUS
1000.0000 mL | Freq: Once | INTRAVENOUS | Status: AC
  Administered 2021-10-16: 1000 mL via INTRAVENOUS

## 2021-10-16 MED ORDER — SCOPOLAMINE 1 MG/3DAYS TD PT72
1.0000 | MEDICATED_PATCH | TRANSDERMAL | Status: DC
  Administered 2021-10-16: 1.5 mg via TRANSDERMAL
  Filled 2021-10-16 (×2): qty 1

## 2021-10-16 MED ORDER — MIDAZOLAM HCL 5 MG/5ML IJ SOLN
INTRAMUSCULAR | Status: DC | PRN
  Administered 2021-10-16: 2 mg via INTRAVENOUS

## 2021-10-16 MED ORDER — INSULIN ASPART 100 UNIT/ML IJ SOLN: 0.0000 [IU] | Freq: Three times a day (TID) | INTRAMUSCULAR | Status: DC

## 2021-10-16 MED ORDER — IOHEXOL 300 MG/ML  SOLN
INTRAMUSCULAR | Status: DC | PRN
  Administered 2021-10-16: 6 mL

## 2021-10-16 MED ORDER — MIDAZOLAM HCL 2 MG/2ML IJ SOLN
INTRAMUSCULAR | Status: AC
  Filled 2021-10-16: qty 2

## 2021-10-16 MED ORDER — PHENYLEPHRINE HCL-NACL 20-0.9 MG/250ML-% IV SOLN
INTRAVENOUS | Status: DC | PRN
  Administered 2021-10-16: 40 ug/min via INTRAVENOUS

## 2021-10-16 MED ORDER — ALBUMIN HUMAN 5 % IV SOLN
INTRAVENOUS | Status: AC
  Filled 2021-10-16: qty 250

## 2021-10-16 MED ORDER — INSULIN ASPART 100 UNIT/ML IJ SOLN: 0.0000 [IU] | Freq: Every day | INTRAMUSCULAR | Status: DC

## 2021-10-16 MED ORDER — FENTANYL CITRATE (PF) 100 MCG/2ML IJ SOLN
INTRAMUSCULAR | Status: AC
  Filled 2021-10-16: qty 2

## 2021-10-16 MED ORDER — LACTATED RINGERS IV BOLUS (SEPSIS)
1000.0000 mL | Freq: Once | INTRAVENOUS | Status: AC
  Administered 2021-10-16: 1000 mL via INTRAVENOUS

## 2021-10-16 MED ORDER — FENTANYL CITRATE (PF) 100 MCG/2ML IJ SOLN
INTRAMUSCULAR | Status: DC | PRN
  Administered 2021-10-16: 50 ug via INTRAVENOUS

## 2021-10-16 MED ORDER — PHENYLEPHRINE HCL-NACL 20-0.9 MG/250ML-% IV SOLN
25.0000 ug/min | INTRAVENOUS | Status: DC
  Administered 2021-10-16: 100 ug/min via INTRAVENOUS
  Administered 2021-10-16: 120 ug/min via INTRAVENOUS
  Filled 2021-10-16 (×3): qty 250

## 2021-10-16 MED ORDER — INSULIN ASPART 100 UNIT/ML IJ SOLN
10.0000 [IU] | Freq: Once | INTRAMUSCULAR | Status: AC
  Administered 2021-10-16: 10 [IU] via SUBCUTANEOUS
  Filled 2021-10-16: qty 0.1

## 2021-10-16 MED ORDER — POTASSIUM CHLORIDE 10 MEQ/100ML IV SOLN: 10.0000 meq | INTRAVENOUS | Status: AC

## 2021-10-16 MED ORDER — PHENYLEPHRINE 80 MCG/ML (10ML) SYRINGE FOR IV PUSH (FOR BLOOD PRESSURE SUPPORT)
PREFILLED_SYRINGE | INTRAVENOUS | Status: DC | PRN
  Administered 2021-10-16 (×4): 80 ug via INTRAVENOUS

## 2021-10-16 MED ORDER — ALBUMIN HUMAN 5 % IV SOLN: INTRAVENOUS | Status: DC | PRN

## 2021-10-16 MED ORDER — PROPOFOL 500 MG/50ML IV EMUL
INTRAVENOUS | Status: DC | PRN
  Administered 2021-10-16: 40 ug/kg/min via INTRAVENOUS
  Administered 2021-10-16: 40 mg via INTRAVENOUS

## 2021-10-16 SURGICAL SUPPLY — 16 items
BAG URO CATCHER STRL LF (MISCELLANEOUS) ×2 IMPLANT
BASKET ZERO TIP NITINOL 2.4FR (BASKET) IMPLANT
BSKT STON RTRVL ZERO TP 2.4FR (BASKET)
CATH URETL OPEN END 6FR 70 (CATHETERS) IMPLANT
CLOTH BEACON ORANGE TIMEOUT ST (SAFETY) ×2 IMPLANT
GLOVE SURG LX 7.5 STRW (GLOVE) ×1
GLOVE SURG LX STRL 7.5 STRW (GLOVE) ×1 IMPLANT
GOWN STRL REUS W/ TWL XL LVL3 (GOWN DISPOSABLE) ×1 IMPLANT
GOWN STRL REUS W/TWL XL LVL3 (GOWN DISPOSABLE) ×2
GUIDEWIRE ANG ZIPWIRE 038X150 (WIRE) IMPLANT
GUIDEWIRE STR DUAL SENSOR (WIRE) ×2 IMPLANT
KIT TURNOVER KIT A (KITS) IMPLANT
MANIFOLD NEPTUNE II (INSTRUMENTS) ×2 IMPLANT
PACK CYSTO (CUSTOM PROCEDURE TRAY) ×2 IMPLANT
STENT URET 6FRX24 CONTOUR (STENTS) ×1 IMPLANT
TUBING CONNECTING 10 (TUBING) ×2 IMPLANT

## 2021-10-16 NOTE — Transfer of Care (Signed)
Immediate Anesthesia Transfer of Care Note ? ?Patient: Katherine Gay ? ?Procedure(s) Performed: Procedure(s): ?CYSTOSCOPY WITH RETROGRADE PYELOGRAM/URETERAL STENT PLACEMENT (Right) ? ?Patient Location: PACU  ? ?Anesthesia Type:MAC ? ?Level of Consciousness: awake, alert  and oriented ? ?Airway & Oxygen Therapy: Patient Spontanous Breathing and Patient connected to nasal cannula oxygen ? ?Post-op Assessment: Report given to RN and Post -op Vital signs reviewed and stable ? ?Post vital signs: Reviewed and stable ? ?Last Vitals:  ?Vitals:  ? 10/16/21 1415 10/16/21 1507  ?BP:  104/60  ?Pulse: (!) 134 (!) 132  ?Resp: (!) 40 (!) 37  ?Temp:  (!) 38.2 ?C  ?SpO2: 95% 96%  ? ? ?Complications: No apparent anesthesia complications ? ?

## 2021-10-16 NOTE — ED Provider Notes (Addendum)
[facility]-EMERGENCY DEPT Provider Note   CSN: [id number] Arrival date & time: 10/16/21  1009     History  Chief Complaint  Patient presents with   Hypotension   Fever    Katherine Gay is a 51 y.o. female.  Presenting to the emergency department due to concern for hypotension, fever in setting of recent kidney stone.  Patient reports that she went to the ER on Wednesday morning due to concern for right-sided pain.  She was diagnosed with a kidney stone, instructed to follow-up with urology.  Patient reports that Wednesday night she had a fever up to 101.  Yesterday she felt fatigued, still having some mild pain on her side.  Low-grade temperature but does not think she had a high fever again.  Today pain is mild, right lower, right side.  No obvious alleviating or aggravating factors.  She reports that she went to the urology office today and was instructed to go directly to ER due to low blood pressure.  Additional history obtained from Dr. Mena Goes on-call for urology.  He states that in clinic this morning they completed an ultrasound and an x-ray and findings are concerning for worsening right hydronephrosis.  He is concern for sepsis from an infected kidney stone.  HPI     Home Medications Prior to Admission medications   Medication Sig Start Date End Date Taking? Authorizing Provider  HYDROcodone-acetaminophen (NORCO/VICODIN) 5-325 MG tablet Take 1 tablet by mouth every 6 (six) hours as needed for severe pain. 10/14/21   Al Decant, PA-C  JARDIANCE 10 MG TABS tablet Take 10 mg by mouth daily. 09/11/19   [provider]  losartan (COZAAR) 100 MG tablet Take 100 mg by mouth daily.    [provider]  metFORMIN (GLUCOPHAGE) 500 MG tablet Take 1,000 mg by mouth 2 (two) times daily with a meal.     [provider]  ondansetron (ZOFRAN) 4 MG tablet Take 1 tablet (4 mg total) by mouth every 8 (eight) hours as needed for nausea or  vomiting. 10/14/21   Al Decant, PA-C  tamsulosin (FLOMAX) 0.4 MG CAPS capsule Take 1 capsule (0.4 mg total) by mouth daily after breakfast. 10/14/21   Al Decant, PA-C  valACYclovir (VALTREX) 500 MG tablet Take 1 tablet (500 mg total) by mouth 2 (two) times daily. Patient taking differently: Take 500 mg by mouth daily.  04/13/12   Silverio Lay, MD      Allergies    Patient has no known allergies.    Review of Systems   Review of Systems  Constitutional:  Negative for chills and fever.  HENT:  Negative for ear pain and sore throat.   Eyes:  Negative for pain and visual disturbance.  Respiratory:  Negative for cough and shortness of breath.   Cardiovascular:  Negative for chest pain and palpitations.  Gastrointestinal:  Negative for abdominal pain and vomiting.  Genitourinary:  Positive for dysuria and flank pain. Negative for hematuria.  Musculoskeletal:  Negative for arthralgias and back pain.  Skin:  Negative for color change and rash.  Neurological:  Negative for seizures and syncope.  All other systems reviewed and are negative.  Physical Exam Updated Vital Signs BP 96/66   Pulse (!) 107   Temp 98.4 F (36.9 C) (Oral)   Resp (!) 29   SpO2 92%  Physical Exam Vitals and nursing note reviewed.  Constitutional:      General: She is not in acute  distress.    Appearance: She is well-developed.  HENT:     Head: Normocephalic and atraumatic.  Eyes:     Conjunctiva/sclera: Conjunctivae normal.  Cardiovascular:     Rate and Rhythm: Normal rate and regular rhythm.     Heart sounds: No murmur heard. Pulmonary:     Effort: Pulmonary effort is normal. No respiratory distress.     Breath sounds: Normal breath sounds.  Abdominal:     Palpations: Abdomen is soft.     Tenderness: There is no abdominal tenderness.  Musculoskeletal:        General: No swelling.     Cervical back: Neck supple.  Skin:    General: Skin is warm and dry.     Capillary Refill:  Capillary refill takes less than 2 seconds.  Neurological:     General: No focal deficit present.     Mental Status: She is alert.  Psychiatric:        Mood and Affect: Mood normal.    ED Results / Procedures / Treatments   Labs (all labs ordered are listed, but only abnormal results are displayed) Labs Reviewed  LACTIC ACID, PLASMA - Abnormal; Notable for the following components:      Result Value   Lactic Acid, Venous 4.0 (*)    All other components within normal limits  COMPREHENSIVE METABOLIC PANEL - Abnormal; Notable for the following components:   Sodium 123 (*)    Chloride 90 (*)    CO2 19 (*)    Glucose, Bld 527 (*)    BUN 52 (*)    Creatinine, Ser 2.86 (*)    Calcium 8.0 (*)    Albumin 3.2 (*)    AST 44 (*)    ALT 46 (*)    GFR, Estimated 19 (*)    All other components within normal limits  CBC WITH DIFFERENTIAL/PLATELET - Abnormal; Notable for the following components:   HCT 35.8 (*)    Neutro Abs 8.9 (*)    Lymphs Abs 0.4 (*)    Abs Immature Granulocytes 0.25 (*)    All other components within normal limits  RESP PANEL BY RT-PCR (FLU A&B, COVID) ARPGX2  CULTURE, BLOOD (ROUTINE X 2)  CULTURE, BLOOD (ROUTINE X 2)  URINE CULTURE  PROTIME-INR  LACTIC ACID, PLASMA  URINALYSIS, ROUTINE W REFLEX MICROSCOPIC  I-STAT BETA HCG BLOOD, ED ([facility], [facility], [facility] ONLY)    EKG EKG Interpretation  Date/Time:  Friday Oct 16 2021 10:24:21 EDT Ventricular Rate:  122 PR Interval:  132 QRS Duration: 88 QT Interval:  296 QTC Calculation: 422 R Axis:   82 Text Interpretation: Sinus tachycardia Confirmed by Marianna Fuss ([id number]) on 10/16/2021 11:48:59 AM  Radiology DG Chest Port 1 View  Result Date: 10/16/2021 CLINICAL DATA:  Fever after recent urologic procedure. Questionable sepsis. EXAM: PORTABLE CHEST 1 VIEW COMPARISON:  CT abdomen and pelvis-10/14/2021 FINDINGS: Examination is degraded due to patient body habitus and portable technique. Normal cardiac silhouette and  mediastinal contours given reduced lung volumes and AP projection. Minimal bibasilar heterogeneous opacities favored to represent atelectasis. No discrete focal airspace opacities. No pleural effusion or pneumothorax. No evidence of edema. No acute osseous abnormalities. IMPRESSION: Bibasilar atelectasis without superimposed acute cardiopulmonary disease on this hypoventilated examination. Electronically Signed   By: Simonne Come M.D.   On: 10/16/2021 11:07    Procedures .Critical Care Performed by: Milagros Loll, MD Authorized by: Milagros Loll, MD   Critical care provider statement:    Critical  care time (minutes):  46   Critical care was necessary to treat or prevent imminent or life-threatening deterioration of the following conditions:  Renal failure and sepsis   Critical care was time spent personally by me on the following activities:  Development of treatment plan with patient or surrogate, discussions with consultants, evaluation of patient's response to treatment, examination of patient, ordering and review of laboratory studies, ordering and review of radiographic studies, ordering and performing treatments and interventions, pulse oximetry, re-evaluation of patient's condition and review of old charts    Medications Ordered in ED Medications  lactated ringers infusion ( Intravenous New Bag/Given 10/16/21 1112)  lactated ringers infusion (has no administration in time range)  lactated ringers bolus 2,000 mL (has no administration in time range)  lactated ringers bolus 1,000 mL (1,000 mLs Intravenous New Bag/Given 10/16/21 1046)  ceFEPIme (MAXIPIME) 2 g in sodium chloride 0.9 % 100 mL IVPB (2 g Intravenous New Bag/Given 10/16/21 1054)  fentaNYL (SUBLIMAZE) injection 50 mcg (50 mcg Intravenous Given 10/16/21 1056)    ED Course/ Medical Decision Making/ A&P                           Medical Decision Making Amount and/or Complexity of Data Reviewed Labs: ordered. Radiology:  ordered. ECG/medicine tests: ordered.  Risk OTC drugs. Prescription drug management. Decision regarding hospitalization.   51 year old lady presenting to the ER due to concern for flank pain, fever, low blood pressure.  Reviewed ER visit from 2 days ago, diagnosed with 6 mm stone at right UVJ obstructing and a 7 mm renal calculus.  Went to urology office today and was noted to be hypotensive.  I discussed the case in detail with Dr. Mena Goes who was the urologist that saw her this morning.  He states that the ultrasound that was completed was concerning for increasing hydronephrosis.  He is concerned about sepsis from an infected kidney stone.  Patient was started sepsis protocol, given IV antibiotics, IV fluid bolus.  Her blood pressure improved, tachycardia improved.  Lactic acid is elevated.  Given the urologist obtained imaging this morning, he states he does not need any repeat imaging in ER.  He is planning to place a stent later today.  Request patient be admitted to medicine and kept n.p.o. and is dissipation of procedure.  Patient is agreeable for admission.  Daughter updated throughout stay.  I reviewed ultrasound images from [facility] that were completed this morning, per the report there is concern for kidney being edematous in addition to the hydronephrosis.   Discussed with Dr. Ronaldo Miyamoto who will admit.       Final Clinical Impression(s) / ED Diagnoses Final diagnoses:  Sepsis, due to unspecified organism, unspecified whether acute organ dysfunction present (HCC)  Acute renal failure, unspecified acute renal failure type (HCC)  Ureterolithiasis    Rx / DC Orders ED Discharge Orders     None         Milagros Loll, MD 10/16/21 1159    Milagros Loll, MD 10/16/21 1343

## 2021-10-16 NOTE — Discharge Instructions (Signed)
Be sure to follow-up with Dr. Marlou Porch to plan stent/stone removal. ?

## 2021-10-16 NOTE — Progress Notes (Signed)
Pharmacy Antibiotic Note ? ?Katherine Gay is a 51 y.o. female admitted on 10/16/2021 with UTI.  Pharmacy has been consulted for cefepime dosing. ? ?Plan: ?Cefepime 2g IV x 1 already given today at 1054 ?Per current renal function, start cefepime 2g IV q24 thereafter ? ?Height: 5\' 6"  (167.6 cm) ?Weight: 93 kg (205 lb) ?IBW/kg (Calculated) : 59.3 ? ?Temp (24hrs), Avg:99.4 ?F (37.4 ?C), Min:98.2 ?F (36.8 ?C), Max:102.5 ?F (39.2 ?C) ? ?Recent Labs  ?Lab 10/14/21 ?0441 10/16/21 ?1034 10/16/21 ?1200  ?WBC 15.2* 10.1  --   ?CREATININE 0.80 2.86*  --   ?LATICACIDVEN  --  4.0* 7.4*  ?  ?Estimated Creatinine Clearance: 26.1 mL/min (A) (by C-G formula based on SCr of 2.86 mg/dL (H)).   ? ?Allergies  ?Allergen Reactions  ? Pollen Extract Other (See Comments)  ? ? ?Thank you for allowing pharmacy to be a part of this patient?s care. ? ?12/16/21 ?10/16/2021 2:00 PM ? ?

## 2021-10-16 NOTE — Anesthesia Preprocedure Evaluation (Addendum)
Anesthesia Evaluation  ?Patient identified by MRN, date of birth, ID band ?Patient awake ? ? ? ?Reviewed: ?Allergy & Precautions, NPO status , Patient's Chart, lab work & pertinent test results ? ?Airway ?Mallampati: II ? ?TM Distance: >3 FB ?Neck ROM: Full ? ? ? Dental ?no notable dental hx. ? ?  ?Pulmonary ?neg pulmonary ROS,  ?  ?Pulmonary exam normal ?breath sounds clear to auscultation ? ? ? ? ? ? Cardiovascular ?hypertension, Pt. on medications ?Normal cardiovascular exam ?Rhythm:Regular Rate:Normal ? ?Sinus tachycardia ?Confirmed by Madalyn Rob [phone]) on 10/16/2021 11:48:59 AM ?  ?Neuro/Psych ?PSYCHIATRIC DISORDERS Depression negative neurological ROS ?   ? GI/Hepatic ?Neg liver ROS, No current nausea or vomiting. ?  ?Endo/Other  ?negative endocrine ROSdiabetes, Type 2, Oral Hypoglycemic Agents ? Renal/GU ?Renal disease (kidney stones)  ?negative genitourinary ?  ?Musculoskeletal ?negative musculoskeletal ROS ?(+)  ? Abdominal ?  ?Peds ?negative pediatric ROS ?(+)  Hematology ?negative hematology ROS ?(+)   ?Anesthesia Other Findings ? ? Reproductive/Obstetrics ?negative OB ROS ? ?  ? ? ? ? ? ? ? ? ? ? ? ? ? ?  ?  ? ? ? ? ? ? ?Anesthesia Physical ?Anesthesia Plan ? ?ASA: 4 and emergent ? ?Anesthesia Plan: MAC  ? ?Post-op Pain Management:   ? ?Induction: Intravenous ? ?PONV Risk Score and Plan: 3 and Treatment may vary due to age or medical condition, Midazolam, Ondansetron, Dexamethasone, Scopolamine patch - Pre-op and Propofol infusion ? ?Airway Management Planned: Natural Airway and Simple Face Mask ? ?Additional Equipment: None ? ?Intra-op Plan:  ? ?Post-operative Plan: Extubation in OR ? ?Informed Consent: I have reviewed the patients History and Physical, chart, labs and discussed the procedure including the risks, benefits and alternatives for the proposed anesthesia with the patient or authorized representative who has indicated his/her understanding and  acceptance.  ? ? ? ?Dental advisory given ? ?Plan Discussed with: Anesthesiologist and CRNA ? ?Anesthesia Plan Comments: (Discussed case with ER doc. BS 527, received 10units, now BS 402. Their plan is to start endotool. Patient has a stepdown bed. Norton Blizzard, MD  ? ?Patient continues to be febrile. Will give IV tylenol intraop.  Will give 10 units SQ insulin now and re- check in PACU. Plan for MAC if ok with surgeon. If not, GA.  Norton Blizzard, MD  ? ?)  ? ? ?Anesthesia Quick Evaluation ? ?

## 2021-10-16 NOTE — Sepsis Progress Note (Signed)
Sepsis protocol monitored by eLink 

## 2021-10-16 NOTE — H&P (Signed)
?History and Physical  ? ? ?Patient: Katherine Gay UOH:[id number] DOB: [birth year] ?DOA: 10/16/2021 ?DOS: the patient was seen and examined on 10/16/2021 ?PCP: Stamey, Verda Cumins, FNP  ?Patient coming from: Home ? ?Chief Complaint:  ?Chief Complaint  ?Patient presents with  ? Hypotension  ? Fever  ? ?HPI: Katherine Gay is a 51 y.o. female with medical history significant of DM, HTN. Presenting with right flank/RLQ pain. Symptoms started a 2 days ago. She had sharp pain that radiating into her groin. She went to the ED and was found to have renal stones. She was given zofran, norco, and flomax. She had directions to return if she became febrile. She was referred to urology. When she went home, she had a temp of 102 that night, but decided to stay home. She felt dizzy yesterday and had N/V. She tried her zofran, norco, and flomax; however, they didn't help. She went to the urology clinic this morning and was found to be hypotensive. She was directed to the ED. She denies any other aggravating or alleviating factors.   ? ?Review of Systems: As mentioned in the history of present illness. All other systems reviewed and are negative. ?Past Medical History:  ?Diagnosis Date  ? Depression   ? HSV-2 infection   ? HTN (hypertension)   ? Nephrolithiasis   ? bilateral -- nonobstructive per ct 11-27-2014  ? Right ureteral stone   ? Type 2 diabetes mellitus (HCC)   ? Urinary frequency   ? ?Past Surgical History:  ?Procedure Laterality Date  ? CESAREAN SECTION  2004  ? CYSTOSCOPY WITH RETROGRADE PYELOGRAM, URETEROSCOPY AND STENT PLACEMENT Right 08/01/2015  ? Procedure: CYSTOSCOPY WITH RIGHT RETROGRADE PYELOGRAM, URETEROSCOPY AND STENT PLACEMENT;  Surgeon: Crist Fat, MD;  Location: [facility];  Service: Urology;  Laterality: Right;  ? HOLMIUM LASER APPLICATION Right 08/01/2015  ? Procedure: HOLMIUM LASER APPLICATION;  Surgeon: Crist Fat, MD;  Location: [facility];  Service: Urology;   Laterality: Right;  ? LAPAROSCOPIC CHOLECYSTECTOMY  08-13-2009  ? WISDOM TOOTH EXTRACTION  2011  ? ?Social History:  reports that she has never smoked. She has never used smokeless tobacco. She reports current alcohol use. She reports that she does not use drugs. ? ?No Known Allergies ? ?Family History  ?Problem Relation Age of Onset  ? Cancer Maternal Grandmother   ?     BREAST  ? ? ?Prior to Admission medications   ?Medication Sig Start Date End Date Taking? Authorizing Provider  ?HYDROcodone-acetaminophen (NORCO/VICODIN) 5-325 MG tablet Take 1 tablet by mouth every 6 (six) hours as needed for severe pain. 10/14/21   Al Decant, PA-C  ?JARDIANCE 10 MG TABS tablet Take 10 mg by mouth daily. 09/11/19   [provider]  ?losartan (COZAAR) 100 MG tablet Take 100 mg by mouth daily.    [provider]  ?metFORMIN (GLUCOPHAGE) 500 MG tablet Take 1,000 mg by mouth 2 (two) times daily with a meal.     [provider]  ?ondansetron (ZOFRAN) 4 MG tablet Take 1 tablet (4 mg total) by mouth every 8 (eight) hours as needed for nausea or vomiting. 10/14/21   Al Decant, PA-C  ?tamsulosin (FLOMAX) 0.4 MG CAPS capsule Take 1 capsule (0.4 mg total) by mouth daily after breakfast. 10/14/21   Al Decant, PA-C  ?valACYclovir (VALTREX) 500 MG tablet Take 1 tablet (500 mg total) by mouth 2 (two) times daily. ?Patient taking differently: Take 500 mg  by mouth daily.  04/13/12   Silverio Lay, MD  ? ? ?Physical Exam: ?Vitals:  ? 10/16/21 1145 10/16/21 1213 10/16/21 1230 10/16/21 1249  ?BP: (!) 113/46  (!) 192/104 (!) 151/90  ?Pulse: (!) 110 (!) 159 (!) 153 (!) 150  ?Resp: (!) 24 (!) 29 (!) 36 (!) 26  ?Temp:  (!) 102.5 ?F (39.2 ?C)    ?TempSrc:  Oral    ?SpO2: 95% 94% 96% 96%  ? ?General: 51 y.o. female resting in bed in NAD ?Eyes: PERRL, normal sclera ?ENMT: Nares patent w/o discharge, orophaynx clear, dentition normal, ears w/o discharge/lesions/ulcers ?Neck: Supple, trachea  midline ?Cardiovascular: tachy, +S1, S2, no m/g/r, equal pulses throughout ?Respiratory: CTABL, no w/r/r, normal WOB ?GI: BS+, NDNT, no masses noted, no organomegaly noted ?MSK: No e/c/c ?Neuro: A&O x 3, no focal deficits ?Psyc: somewhat drowsy, calm/cooperative ? ?Data Reviewed: ? ?Na+  123 ?Cl- 90 ?CO2  19 ?Glucose 527 ?Scr  2.86 ?Lactic acid - 4.0 - 7.4 ?WBC  10.1 ? ?CXR: Bibasilar atelectasis without superimposed acute cardiopulmonary disease on this hypoventilated examination. ? ?Assessment and Plan: ?No notes have been filed under this hospital service. ?Service: Hospitalist ?Sepsis secondary to infected renal stone ?Obstructive uropathy ?    - admit to inpt, SDU ?    - continue cefepime, fluids ?    - urology onboard, appreciate assistance, going to OR today for stent ?    - follow Bld Cx, Ucx, lactic acid ? ?AKI ?    - fluids, follow ? ?HHS ?Uncontrolled DM2 ?    - Endotool for HHS ?    - NPO for now ?    - DM education ?    - follow glucose ? ?Hx of HTN ?    - hypotensive at presentation; responding to fluids, hold home meds for now ? ?Hyponatremia ?    - Na+ corrects to 130; fluids, follow ? ?Advance Care Planning:   Code Status: FULL ? ?Consults: Urology ? ?Family Communication: w/ family ? ?Severity of Illness: ?The appropriate patient status for this patient is INPATIENT. Inpatient status is judged to be reasonable and necessary in order to provide the required intensity of service to ensure the patient's safety. The patient's presenting symptoms, physical exam findings, and initial radiographic and laboratory data in the context of their chronic comorbidities is felt to place them at high risk for further clinical deterioration. Furthermore, it is not anticipated that the patient will be medically stable for discharge from the hospital within 2 midnights of admission.  ? ?* I certify that at the point of admission it is my clinical judgment that the patient will require inpatient hospital care spanning  beyond 2 midnights from the point of admission due to high intensity of service, high risk for further deterioration and high frequency of surveillance required.* ? ?Author: ?Teddy Spike, DO ?10/16/2021 12:55 PM ? ?For on call review [email or web address].  ?

## 2021-10-16 NOTE — Anesthesia Postprocedure Evaluation (Signed)
Anesthesia Post Note ? ?Patient: Katherine Gay ? ?Procedure(s) Performed: CYSTOSCOPY WITH RETROGRADE PYELOGRAM/URETERAL STENT PLACEMENT (Right: Ureter) ? ?  ? ?Patient location during evaluation: PACU ?Anesthesia Type: MAC ?Level of consciousness: awake and alert ?Pain management: pain level controlled ?Vital Signs Assessment: post-procedure vital signs reviewed and stable ?Respiratory status: spontaneous breathing, nonlabored ventilation, respiratory function stable and patient connected to nasal cannula oxygen ?Cardiovascular status: stable and blood pressure returned to baseline ?Postop Assessment: no apparent nausea or vomiting ?Anesthetic complications: no ? ? ?No notable events documented. ? ?Last Vitals:  ?Vitals:  ? 10/16/21 1755 10/16/21 1800  ?BP: (!) 76/55 (!) 79/55  ?Pulse: (!) 114 (!) 115  ?Resp: (!) 34 (!) 36  ?Temp:    ?SpO2: 96% 97%  ?  ?Last Pain:  ?Vitals:  ? 10/16/21 1745  ?TempSrc:   ?PainSc: 0-No pain  ? ? ?  ?  ?  ?  ?  ?  ? ?[name] ? ? ? ? ?

## 2021-10-16 NOTE — Progress Notes (Signed)
An USGPIV (ultrasound guided PIV) has been placed for short-term vasopressor infusion. A correctly placed ivWatch must be used when administering Vasopressors. Should this treatment be needed beyond 72 hours, central line access should be obtained.  It will be the responsibility of the bedside nurse to follow best practice to prevent extravasations.   ?

## 2021-10-16 NOTE — Progress Notes (Signed)
Patient has completed cystoscopy with right retrograde pyelogram right ureteral stent placement. She is now hypotensive and on neo. Will add fluid bolus. Spoke with PCCM. They will follow for pressor management. Her glucose has come down to 265 last check. Insulin gtt was never started. With improvement in her glucose on SSI during her procedure, we will stop the Endotool protocol and switch to SSI. Continue abx, fluids. Appreciate CCM and urology.  ? ?Teddy Spike, DO ? ?

## 2021-10-16 NOTE — Consult Note (Addendum)
? ?NAME:  Katherine Gay, MRN:  [id number], DOB:  1970-07-22, LOS: 0 ?ADMISSION DATE:  10/16/2021, CONSULTATION DATE:  10/16/2021 ?REFERRING MD:  Dr. Ronaldo MiyamotoKyle, CHIEF COMPLAINT:  Sepsis   ? ?History of Present Illness:  ?Katherine Gay is a 51 y.o. female with a PMH significant for hypertension, type 2 diabetes, renal stone, urinary frequency, and depression who presented to [facility] long ED 5/12 for complaints of right flank/right upper quadrant pain that radiates to right groin.  Per patient symptoms began 2 days at which time she was evaluated in ED and found to have renal stone she was given Zofran, Norco, and Flomax with directions to her follow-up with outpatient urology or return if she became febrile.  Patient later developed fever of 102 night of original ED visit but decided to stay home and present to neurology clinic the following morning.  At urology office she was found to be hypotensive with concerns of sepsis at which time she was directed to the emergency department for ? ?On ED arrival patient was seen febrile with temperature 102.5, tachypnea with respiratory rate 24, tachycardic with heart rate 110-137.  Lab work significant for NA 123, glucose 527, creatinine 2.86, AST 44, ALT 46, lactic acid 4.0 > 7.4, and WBC 10.1.  Neurology consulted in ED and decision made to place stent urgently.  Postoperatively patient remained hypotensive requiring initiation of a low-dose peripheral pressors.  PCCM consulted for further management and transfer to ICU ? ?Pertinent  Medical History  ?Hypertension, type 2 diabetes, renal stone, urinary frequency, and depression ? ?Significant Hospital Events: ?Including procedures, antibiotic start and stop dates in addition to other pertinent events   ?5/12 presented with right flank/right upper quadrant pain with known renal stone.  In septic on ED arrival.  Underwent stent placement by urology.  Postop seen hypotensive requiring peripheral pressors ? ?Interim History /  Subjective:  ?As above ? ?Objective   ?Blood pressure (!) 84/54, pulse (!) 113, temperature 99 ?F (37.2 ?C), resp. rate (!) 32, height 5\' 6"  (1.676 m), weight 93 kg, last menstrual period 03/27/2012, SpO2 95 %. ?   ?   ? ?Intake/Output Summary (Last 24 hours) at 10/16/2021 1801 ?Last data filed at 10/16/2021 1707 ?Gross per 24 hour  ?Intake 3364.48 ml  ?Output --  ?Net 3364.48 ml  ? ?Filed Weights  ? 10/16/21 1358  ?Weight: 93 kg  ? ? ?Examination: ?General: Acute ill appearing middle aged female lying in bed in no acute distress ?HEENT: ETT, MM pink/moist, PERRL,  ?Neuro: Alert and oriented 3, moves all extremities spontaneously, non-focal  ?CV: s1s2 regular rate and rhythm, no murmur, rubs, or gallops,  ?PULM:  Clear to ascultation, no increased work of breathing, no added breath sounds  ?GI: soft, bowel sounds active in all 4 quadrants, non-tender, non-distended ?Extremities: warm/dry, no edema  ?Skin: no rashes or lesions ? ?Resolved Hospital Problem list   ? ? ?Assessment & Plan:  ?Severe sepsis secondary to obstructing renal stone ?-Patient presented with tachycardia, tachypnea, fever, and severely elevated lactic acid with associated organ dysfunction including acute AKI and mild elevated LFTs ?P: ?Admit ICU ?Supplemental oxygen as needed for sat goal greater than 92 ?Follow culture data ?Continue IV cefepime ?Aggressive IV hydration provided on admission ?Continue peripheral pressors for map goal greater than 65 ?Trend lactic acid ?Procalcitonin ?Monitor urine output ? ?Right obstructing renal stone ?-Seen on CT imaging 5/10 ?-S/p stent per Dr. Mena GoesEskridge 5/12 ?Acute renal failure ?-Creatinine 2.86, GFR 19 on  admission 5/12 compared to creatinine 0.80, GFR greater than 65/10 ?P: ?Primary management per urology ?Management of sepsis above ?Follow urine output ?Monitor urine output ?Trend Bmet ?Avoid nephrotoxins ?Ensure adequate renal perfusion  ?IV hydration ? ?Hyperglycemia with history of type 2 diabetes   ?-Initial concern for HHS on a arrival with CBG 527, insulin drip was not started prior to surgery ?P: ?Postop and post IV resuscitation CBG 274 ?Close monitoring of CBG  ?Start SSI  ?Check hemoglobin A1C ?CBG goal 140-180 ?Hold home Metformin ? ?History of hypertension ?-Home medications include Cozaar ?P: ?Resume home medications when appropriate  ? ?Hyponatremia ?P: ?Trend Bmet  ? ?Best Practice (right click and "Reselect all SmartList Selections" daily)  ? ?Diet/type: clear liquids ?DVT prophylaxis: SCD ?GI prophylaxis: N/A ?Lines: N/A ?Foley:  Yes, and it is still needed ?Code Status:  full code ?Last date of multidisciplinary goals of care discussion: Full code per patient  ? ?Labs   ?CBC: ?Recent Labs  ?Lab 10/14/21 ?0441 10/16/21 ?1034  ?WBC 15.2* 10.1  ?NEUTROABS 13.1* 8.9*  ?HGB 13.4 12.3  ?HCT 40.2 35.8*  ?MCV 89.7 87.1  ?PLT 435* 181  ? ? ?Basic Metabolic Panel: ?Recent Labs  ?Lab 10/14/21 ?0441 10/16/21 ?1034  ?NA 135 123*  ?K 4.9 4.8  ?CL 103 90*  ?CO2 25 19*  ?GLUCOSE 311* 527*  ?BUN 12 52*  ?CREATININE 0.80 2.86*  ?CALCIUM 9.6 8.0*  ? ?GFR: ?Estimated Creatinine Clearance: 26.1 mL/min (A) (by C-G formula based on SCr of 2.86 mg/dL (H)). ?Recent Labs  ?Lab 10/14/21 ?0441 10/16/21 ?1034 10/16/21 ?1200  ?WBC 15.2* 10.1  --   ?LATICACIDVEN  --  4.0* 7.4*  ? ? ?Liver Function Tests: ?Recent Labs  ?Lab 10/14/21 ?0441 10/16/21 ?1034  ?AST 15 44*  ?ALT 18 46*  ?ALKPHOS 67 69  ?BILITOT 0.6 1.2  ?PROT 7.2 7.1  ?ALBUMIN 3.6 3.2*  ? ?Recent Labs  ?Lab 10/14/21 ?0441  ?LIPASE 44  ? ?No results for input(s): AMMONIA in the last 168 hours. ? ?ABG ?   ?Component Value Date/Time  ? PHART 7.4 10/16/2021 1324  ? PCO2ART 25 (L) 10/16/2021 1324  ? PO2ART 77 (L) 10/16/2021 1324  ? HCO3 15.5 (L) 10/16/2021 1324  ? TCO2 22 08/01/2015 0828  ? ACIDBASEDEF 7.5 (H) 10/16/2021 1324  ? O2SAT 97.6 10/16/2021 1324  ?  ? ?Coagulation Profile: ?Recent Labs  ?Lab 10/16/21 ?1034  ?INR 1.2  ? ? ?Cardiac Enzymes: ?No results for  input(s): CKTOTAL, CKMB, CKMBINDEX, TROPONINI in the last 168 hours. ? ?HbA1C: ?No results found for: HGBA1C ? ?CBG: ?Recent Labs  ?Lab 10/16/21 ?1353 10/16/21 ?1507 10/16/21 ?1608 10/16/21 ?1707  ?GLUCAP 402* 340* 328* 263*  ? ? ?Review of Systems:   ?Please see the history of present illness. All other systems reviewed and are negative  ? ?Past Medical History:  ?She,  has a past medical history of Depression, HSV-2 infection, HTN (hypertension), Nephrolithiasis, Right ureteral stone, Type 2 diabetes mellitus (HCC), and Urinary frequency.  ? ?Surgical History:  ? ?Past Surgical History:  ?Procedure Laterality Date  ? CESAREAN SECTION  2004  ? CYSTOSCOPY WITH RETROGRADE PYELOGRAM, URETEROSCOPY AND STENT PLACEMENT Right 08/01/2015  ? Procedure: CYSTOSCOPY WITH RIGHT RETROGRADE PYELOGRAM, URETEROSCOPY AND STENT PLACEMENT;  Surgeon: Crist Fat, MD;  Location: [facility];  Service: Urology;  Laterality: Right;  ? HOLMIUM LASER APPLICATION Right 08/01/2015  ? Procedure: HOLMIUM LASER APPLICATION;  Surgeon: Crist Fat, MD;  Location: [facility];  Service: Urology;  Laterality: Right;  ? LAPAROSCOPIC CHOLECYSTECTOMY  08-13-2009  ? WISDOM TOOTH EXTRACTION  2011  ?  ? ?Social History:  ? reports that she has never smoked. She has never used smokeless tobacco. She reports current alcohol use. She reports that she does not use drugs.  ? ?Family History:  ?Her family history includes Cancer in her maternal grandmother.  ? ?Allergies ?Allergies  ?Allergen Reactions  ? Pollen Extract Other (See Comments)  ?  ? ?Home Medications  ?Prior to Admission medications   ?Medication Sig Start Date End Date Taking? Authorizing Provider  ?clobetasol ointment (TEMOVATE) 0.05 % Apply 1 application. topically 2 (two) times daily as needed (dermatitis). 08/31/21  Yes [provider]  ?diphenhydrAMINE (BENADRYL) 25 MG tablet Take 25 mg by mouth every 8 (eight) hours as needed for allergies  or itching.   Yes [provider]  ?HYDROcodone-acetaminophen (NORCO/VICODIN) 5-325 MG tablet Take 1 tablet by mouth every 6 (six) hours as needed for severe pain. 10/14/21  Yes Al Decant, PA-C  ?losartan

## 2021-10-16 NOTE — Consult Note (Addendum)
? ?Office Visit Report     10/16/2021  ? ?-------------------------------------------------------------------------------- ?  ?Katherine Gay  ?MRN: [id number]  ?DOB: [birth year], 51 year old Female  ?SSN:   ? PRIMARY CARE:  Kae Heller, NP  ?REFERRING:  Janeece Riggers. Hepler, PA  ?PROVIDER:  Berniece Salines, M.D.  ?TREATING:  Jerilee Field, M.D.  ?LOCATION:  [facility] [phone]  ?  ? ?-------------------------------------------------------------------------------- ?  ?CC/HPI: New patient for renal and ureteral stone - she saw Dr. Marlou Porch previously.  ? ?1) renal and ureteral stone-patient developed right flank pain. She underwent CT scan of the abdomen pelvis on 10/14/2021 which revealed a 5 mm right distal stone (may be visible on scout) and she had a 6 mm right lower pole stone. Her white count was 15, creatinine 0.8. Glucose 311. UA showed many bacteria 21-50 white cells with 0-5 red cells.  ? ?She presents this morning and hasn't passed the stone. She's dizzy. She has no emesis and has been drinking water when she is "awake". She says she has been sleeping "a lot". Sleeping "21 hours/day". When she is wakes up she's had urge incontinence on the way to the bathroom as she slept "so long". She reports a temp of 101.2 at home. She has dry mouth. She said they told her to go back to the emergency room if she was feeling this way and had a fever, but she "did not want to wait that long". She has not been on abx.  ? ?Her HR is 137 and BP 73/51. I got a stat KUB and right renal ultrasound-KUB shows the 6 mm stone from the right lower pole is now migrated to the right UPJ with enlarged right renal shadow. Ultrasound confirms right UPJ stone and mild hydronephrosis and right renal swelling.  ? ?Prior stone was 6 years ago - 11 mm - right URS.  ? ? ? ?  ?ALLERGIES: No Allergies ?  ? ?MEDICATIONS: Tamsulosin Hcl  ?Hydrocodone-Acetaminophen  ?Ibuprofen 800 mg tablet Oral  ?Losartan-Hydrochlorothiazide 100  mg-12.5 mg tablet Oral  ?Metformin Hcl 500 mg tablet Oral  ?Ondansetron Hcl  ?Progesterone 100 % powder Does Not Apply  ?Valacyclovir 500 mg tablet Oral  ?Zyrtec 10 mg tablet Oral  ?  ? ?GU PSH: Ureteroscopic laser litho - 2017 ? ?  ?   ?PSH Notes: Cystoscopy Ureteroscopy Lithotripsy Incl Insert Indwelling Ureter Stent, Cholecystectomy, Oral Surgery, Cesarean Section  ? ?NON-GU PSH: Cesarean Delivery Only - 2016 ?Cholecystectomy (open) - 2016 ? ?  ? ?GU PMH: Renal calculus, Nephrolithiasis - 2017 ?Gross hematuria, Gross hematuria - 2017 ?Urinary Tract Inf, Unspec site, Urinary tract infection - 2017 ?GU systems Signs/Symptoms , Unspec, Lower urinary tract symptoms (LUTS) - 2016 ?Low back pain, Low back pain - 2016 ?Other Disorders Of Bladder, Bladder mass - 2016 ?  ? ?NON-GU PMH: Encounter for general adult medical examination without abnormal findings, Encounter for preventive health examination - 2017 ?Personal history of other diseases of the circulatory system, History of hypertension - 2016 ?Personal history of other endocrine, nutritional and metabolic disease, History of diabetes mellitus - 2016 ?Personal history of other mental and behavioral disorders, History of depression - 2016 ?  ? ?FAMILY HISTORY: Kidney Stones - Runs In Family  ? ?SOCIAL HISTORY: No Social History   ?  Notes: Caffeine use, Occupation, Mother alive and healthy, Number of children, Never a smoker, Alcohol use, Married  ? ?REVIEW OF SYSTEMS:    ?GU Review Female:   Patient  denies frequent urination, hard to postpone urination, burning /pain with urination, get up at night to urinate, leakage of urine, stream starts and stops, trouble starting your stream, have to strain to urinate, and being pregnant.  ?Gastrointestinal (Upper):   Patient reports nausea. Patient denies vomiting and indigestion/ heartburn.  ?Gastrointestinal (Lower):   Patient denies diarrhea and constipation.  ?Constitutional:   Patient denies fever, night sweats, weight  loss, and fatigue.  ?Skin:   Patient denies skin rash/ lesion and itching.  ?Eyes:   Patient denies blurred vision and double vision.  ?Ears/ Nose/ Throat:   Patient reports sore throat. Patient denies sinus problems.  ?Hematologic/Lymphatic:   Patient denies swollen glands and easy bruising.  ?Cardiovascular:   Patient denies leg swelling and chest pains.  ?Respiratory:   Patient denies cough and shortness of breath.  ?Endocrine:   Patient denies excessive thirst.  ?Musculoskeletal:   Patient reports back pain. Patient denies joint pain.  ?Neurological:   Patient denies headaches and dizziness.  ?Psychologic:   Patient denies depression and anxiety.  ? ?Notes: Dizziness ?  ? ?VITAL SIGNS:    ?  10/16/2021 10:13 AM 10/16/2021 09:11 AM  ?Weight   205 lb / 92.99 kg  ?Height   66.5 in / 168.91 cm  ?BP 78/60 mmHg 73/51 mmHg  ?Heart Rate 133 /min 137 /min  ?Temperature   98.0 F / 36.6 C  ?BMI 32.6 kg/m?  ? ?MULTI-SYSTEM PHYSICAL EXAMINATION:    ?Constitutional: Well-nourished. No physical deformities. Normally developed. Good grooming. In wheelchair and does not look good. Tepid and weakly - slumped over in wheelchair.   ?Neck: Neck symmetrical, not swollen. Normal tracheal position.  ?Respiratory: No labored breathing, no use of accessory muscles.   ?Cardiovascular: Normal temperature, normal extremity pulses, no swelling, no varicosities.  ?Skin: No paleness, no jaundice, no cyanosis. No lesion, no ulcer, no rash.  ?Neurologic / Psychiatric: Oriented to time, oriented to place, oriented to person. No depression, no anxiety, no agitation.  ?Gastrointestinal: No mass, no tenderness, no rigidity, non obese abdomen.  ?Eyes: Normal conjunctivae. Normal eyelids.  ?Ears, Nose, Mouth, and Throat: Left ear no scars, no lesions, no masses. Right ear no scars, no lesions, no masses. Nose no scars, no lesions, no masses. Normal hearing. Normal lips.  ?Musculoskeletal: Normal gait and station of head and neck.  ? ?  ?Complexity of  Data:  ?X-Ray Review: C.T. Abdomen/Pelvis: Reviewed Films. Discussed With Patient. 2023 ?  ? ?PROCEDURES:    ?     Renal Ultrasound (Limited) - 76775  ?Kidney: Right ?Length: 14.4 cm Depth: 9.4 cm ?Cortical Width: 2.9 cm Width: 6.5 cm ? ?  ?Right Kidney/Ureter:  Mild hydro. Calcification in renal pelvis. Kidney appears edematous.  ?Bladder:  PVR 128.8 ml  ?  ?  ?Patient confirmed No Neulasta OnPro Device.  ? ? ?  ? ?     KUB - 74018  ?A single view of the abdomen is obtained.  ?Calculi:  6 mm stone from the right lower pole is now migrated to the right UPJ with enlarged right renal shadow  ?  ?  ?The bones appeared normal. The bowel gas pattern appeared normal. The soft tissues were unremarkable. ? ?Patient confirmed No Neulasta OnPro Device.  ?  ? ?ASSESSMENT:  ?    ICD-10 Details  ?1 GU:   Renal calculus - N20.0 Chronic, Stable - Discussed that the right lower pole stone has migrated to the right UPJ. We went over the   nature risk benefits and alternatives to cystoscopy with stent. We discussed stent is a temporary measure and the rationale for a staged procedure. All questions answered. We will get her on for stent today and have her follow-up to see nurse practitioner Dr. Marlou Porch to plan ureteroscopy.  ?2   Ureteral calculus - N20.1 Undiagnosed New Problem  ?3 NON-GU:   Severe sepsis with septic shock - R65.21 Undiagnosed New Problem - Discussed with the nurse manager and we don't have the capability to adequately resuscitate her, so we should get her to the emergency room ASAP and we planned to call 911. Patient refused and called her daughter. I recommended we call 911 and as we were discussing it she said her daughter was in the elevator and she is leaving with her daughter. Again I did not recommend private transport. I called the [facility] long emergency room and spoke with the charge nurse. We went over the patient's labs and imaging. She needs resuscitation and blood sugar control by the hospitalist and will  need urgent stent today. They will notify me when she is ready. I discussed she did not need further imaging from a GU point of view. I did speak with Dr. Stevie Kern in the emergency. Her white count t

## 2021-10-16 NOTE — Op Note (Signed)
Preoperative diagnosis: Right ureteral stone, right UPJ stone, sepsis ?Postoperative diagnosis: Same ? ?Procedure: Cystoscopy with right retrograde pyelogram right ureteral stent placement ? ?Surgeon: Junious Silk ? ?Anesthesia: General ? ?Indication for procedure: Katherine Gay is a 51 year old female is passing a right distal stone.  She is felt poorly for 2 days and had fever.  Ultrasound today revealed a right UPJ stone and hydro and KUB confirmed a right lower pole stone had advanced to the right UPJ.  She also may still have a right distal stone. ? ?Findings on cystoscopy the urethra and bladder were unremarkable.  No stone or foreign body in the bladder. ? ?Right retrograde pyelogram-this outlined a single ureter single collecting system unit with a small filling defect at the right UVJ and a large filling defect in the renal pelvis consistent with the stone.  Mild hydronephrosis. ? ?Description of procedure: After consent was obtained patient brought to the operating room.  After adequate anesthesia she was placed lithotomy position and prepped and draped in the usual sterile fashion.  Timeout was performed confirm the patient and procedure.  Cystoscope was passed per urethra and the bladder inspected.  Right ureteral orifice was cannulated with a 6 [place] open-ended catheter and gentle retrograde was obtained.  A sensor wire was then advanced coiled in the upper calyx.  A 6 x 24 cm stent advanced.  The wire was removed with good coil seen in the upper calyx good coil in the bladder.  42 French Foley catheter was placed.  She was awakened taken recovery room in stable condition. ? ?Complications: None ? ?Blood loss: Minimal ? ?Specimens: None ? ?Drains: 6 x 24 cm right ureteral stent, 16 French Foley catheter ? ?Disposition: Patient stable to PACU ?

## 2021-10-16 NOTE — Interval H&P Note (Signed)
History and Physical Interval Note: ? ?10/16/2021 ?4:19 PM ? ?WM FRUCHTER  has presented today for surgery, with the diagnosis of right stone and sepsis.  The various methods of treatment have been discussed with the patient and family. After consideration of risks, benefits and other options for treatment, the patient has consented to  Procedure(s): ?[place] (Right) as a surgical intervention.  The patient's history has been reviewed, patient examined, no change in status, stable for surgery.  I met with Katherine Gay and her daughter in the preop area.  I drew them a picture of the anatomy.  I have reviewed the patient's chart and labs.  Questions were answered to the patient's satisfaction.  We again discussed rationale for a staged procedure and stent only today and importance of follow-up. ? ? ?Festus Aloe ? ? ?

## 2021-10-16 NOTE — Progress Notes (Signed)
A consult was received from an ED physician for cefepime per pharmacy dosing.  The patient's profile has been reviewed for ht/wt/allergies/indication/available labs.   ? ?I agree with the one time order already placed by provider for cefepime 2 g IV.  Further antibiotics/pharmacy consults should be ordered by admitting physician if indicated.       ?                ?Selinda Eon, PharmD, BCPS ?Clinical Pharmacist ?[place] ?Please utilize Amion for appropriate phone number to reach the unit pharmacist [facility]) ?10/16/2021 10:38 AM ? ? ?

## 2021-10-16 NOTE — H&P (View-Only) (Signed)
? ?Office Visit Report     10/16/2021  ? ?-------------------------------------------------------------------------------- ?  ?Katherine Gay  ?MRN: [id number]  ?DOB: [birth year], 51 year old Female  ?SSN:   ? PRIMARY CARE:  Kae Heller, NP  ?REFERRING:  Janeece Riggers. [place], PA  ?PROVIDER:  Berniece Salines, M.D.  ?TREATING:  Jerilee Field, M.D.  ?LOCATION:  [facility] [phone]  ?  ? ?-------------------------------------------------------------------------------- ?  ?CC/HPI: New patient for renal and ureteral stone - she saw Dr. Marlou Porch previously.  ? ?1) renal and ureteral stone-patient developed right flank pain. She underwent CT scan of the abdomen pelvis on 10/14/2021 which revealed a 5 mm right distal stone (may be visible on scout) and she had a 6 mm right lower pole stone. Her white count was 15, creatinine 0.8. Glucose 311. UA showed many bacteria 21-50 white cells with 0-5 red cells.  ? ?She presents this morning and hasn't passed the stone. She's dizzy. She has no emesis and has been drinking water when she is "awake". She says she has been sleeping "a lot". Sleeping "21 hours/day". When she is wakes up she's had urge incontinence on the way to the bathroom as she slept "so long". She reports a temp of 101.2 at home. She has dry mouth. She said they told her to go back to the emergency room if she was feeling this way and had a fever, but she "did not want to wait that long". She has not been on abx.  ? ?Her HR is 137 and BP 73/51. I got a stat KUB and right renal ultrasound-KUB shows the 6 mm stone from the right lower pole is now migrated to the right UPJ with enlarged right renal shadow. Ultrasound confirms right UPJ stone and mild hydronephrosis and right renal swelling.  ? ?Prior stone was 6 years ago - 11 mm - right URS.  ? ? ? ?  ?ALLERGIES: No Allergies ?  ? ?MEDICATIONS: Tamsulosin Hcl  ?Hydrocodone-Acetaminophen  ?Ibuprofen 800 mg tablet Oral  ?Losartan-Hydrochlorothiazide 100  mg-12.5 mg tablet Oral  ?Metformin Hcl 500 mg tablet Oral  ?Ondansetron Hcl  ?Progesterone 100 % powder Does Not Apply  ?Valacyclovir 500 mg tablet Oral  ?Zyrtec 10 mg tablet Oral  ?  ? ?GU PSH: Ureteroscopic laser litho - 2017 ? ?  ?   ?PSH Notes: Cystoscopy Ureteroscopy Lithotripsy Incl Insert Indwelling Ureter Stent, Cholecystectomy, Oral Surgery, Cesarean Section  ? ?NON-GU PSH: Cesarean Delivery Only - 2016 ?Cholecystectomy (open) - 2016 ? ?  ? ?GU PMH: Renal calculus, Nephrolithiasis - 2017 ?Gross hematuria, Gross hematuria - 2017 ?Urinary Tract Inf, Unspec site, Urinary tract infection - 2017 ?GU systems Signs/Symptoms , Unspec, Lower urinary tract symptoms (LUTS) - 2016 ?Low back pain, Low back pain - 2016 ?Other Disorders Of Bladder, Bladder mass - 2016 ?  ? ?NON-GU PMH: Encounter for general adult medical examination without abnormal findings, Encounter for preventive health examination - 2017 ?Personal history of other diseases of the circulatory system, History of hypertension - 2016 ?Personal history of other endocrine, nutritional and metabolic disease, History of diabetes mellitus - 2016 ?Personal history of other mental and behavioral disorders, History of depression - 2016 ?  ? ?FAMILY HISTORY: Kidney Stones - Runs In Family  ? ?SOCIAL HISTORY: No Social History   ?  Notes: Caffeine use, Occupation, Mother alive and healthy, Number of children, Never a smoker, Alcohol use, Married  ? ?REVIEW OF SYSTEMS:    ?GU Review Female:   Patient  denies frequent urination, hard to postpone urination, burning /pain with urination, get up at night to urinate, leakage of urine, stream starts and stops, trouble starting your stream, have to strain to urinate, and being pregnant.  ?Gastrointestinal (Upper):   Patient reports nausea. Patient denies vomiting and indigestion/ heartburn.  ?Gastrointestinal (Lower):   Patient denies diarrhea and constipation.  ?Constitutional:   Patient denies fever, night sweats, weight  loss, and fatigue.  ?Skin:   Patient denies skin rash/ lesion and itching.  ?Eyes:   Patient denies blurred vision and double vision.  ?Ears/ Nose/ Throat:   Patient reports sore throat. Patient denies sinus problems.  ?Hematologic/Lymphatic:   Patient denies swollen glands and easy bruising.  ?Cardiovascular:   Patient denies leg swelling and chest pains.  ?Respiratory:   Patient denies cough and shortness of breath.  ?Endocrine:   Patient denies excessive thirst.  ?Musculoskeletal:   Patient reports back pain. Patient denies joint pain.  ?Neurological:   Patient denies headaches and dizziness.  ?Psychologic:   Patient denies depression and anxiety.  ? ?Notes: Dizziness ?  ? ?VITAL SIGNS:    ?  10/16/2021 10:13 AM 10/16/2021 09:11 AM  ?Weight   205 lb / 92.99 kg  ?Height   66.5 in / 168.91 cm  ?BP 78/60 mmHg 73/51 mmHg  ?Heart Rate 133 /min 137 /min  ?Temperature   98.0 F / 36.6 C  ?BMI 32.6 kg/m?  ? ?MULTI-SYSTEM PHYSICAL EXAMINATION:    ?Constitutional: Well-nourished. No physical deformities. Normally developed. Good grooming. In wheelchair and does not look good. Tepid and weakly - slumped over in wheelchair.   ?Neck: Neck symmetrical, not swollen. Normal tracheal position.  ?Respiratory: No labored breathing, no use of accessory muscles.   ?Cardiovascular: Normal temperature, normal extremity pulses, no swelling, no varicosities.  ?Skin: No paleness, no jaundice, no cyanosis. No lesion, no ulcer, no rash.  ?Neurologic / Psychiatric: Oriented to time, oriented to place, oriented to person. No depression, no anxiety, no agitation.  ?Gastrointestinal: No mass, no tenderness, no rigidity, non obese abdomen.  ?Eyes: Normal conjunctivae. Normal eyelids.  ?Ears, Nose, Mouth, and Throat: Left ear no scars, no lesions, no masses. Right ear no scars, no lesions, no masses. Nose no scars, no lesions, no masses. Normal hearing. Normal lips.  ?Musculoskeletal: Normal gait and station of head and neck.  ? ?  ?Complexity of  Data:  ?X-Ray Review: C.T. Abdomen/Pelvis: Reviewed Films. Discussed With Patient. 2023 ?  ? ?PROCEDURES:    ?     Renal Ultrasound (Limited) - 16109- [id number]  ?Kidney: Right ?Length: 14.4 cm Depth: 9.4 cm ?Cortical Width: 2.9 cm Width: 6.5 cm ? ?  ?Right Kidney/Ureter:  Mild hydro. Calcification in renal pelvis. Kidney appears edematous.  ?Bladder:  PVR 128.8 ml  ?  ?  ?Patient confirmed No Neulasta OnPro Device.  ? ? ?  ? ?     KUB - [id number]  ?A single view of the abdomen is obtained.  ?Calculi:  6 mm stone from the right lower pole is now migrated to the right UPJ with enlarged right renal shadow  ?  ?  ?The bones appeared normal. The bowel gas pattern appeared normal. The soft tissues were unremarkable. ? ?Patient confirmed No Neulasta OnPro Device.  ?  ? ?ASSESSMENT:  ?    ICD-10 Details  ?1 GU:   Renal calculus - N20.0 Chronic, Stable - Discussed that the right lower pole stone has migrated to the right UPJ. We went over the  nature risk benefits and alternatives to cystoscopy with stent. We discussed stent is a temporary measure and the rationale for a staged procedure. All questions answered. We will get her on for stent today and have her follow-up to see nurse practitioner Dr. Marlou Porch to plan ureteroscopy.  ?2   Ureteral calculus - N20.1 Undiagnosed New Problem  ?3 NON-GU:   Severe sepsis with septic shock - R65.21 Undiagnosed New Problem - Discussed with the nurse manager and we don't have the capability to adequately resuscitate her, so we should get her to the emergency room ASAP and we planned to call 911. Patient refused and called her daughter. I recommended we call 911 and as we were discussing it she said her daughter was in the elevator and she is leaving with her daughter. Again I did not recommend private transport. I called the [facility] long emergency room and spoke with the charge nurse. We went over the patient's labs and imaging. She needs resuscitation and blood sugar control by the hospitalist and will  need urgent stent today. They will notify me when she is ready. I discussed she did not need further imaging from a GU point of view. I did speak with Dr. Stevie Kern in the emergency. Her white count t

## 2021-10-16 NOTE — Progress Notes (Signed)
1408 recheck glucose- 328.  Called and informed Dr. Donavan Foil.  ?  No orders given at this time.  Also informed CRNA of same.  Dr. Mena Goes in to see pt. Aware of CBG ?

## 2021-10-16 NOTE — ED Notes (Signed)
Pt shivering at this time. Unable to obtain valid BP.  ?

## 2021-10-16 NOTE — ED Notes (Signed)
Date and time results received: 10/16/21 11:18 AM ? ? ? ?Test: BMP, Lactic Acid  ?Critical Value: Glucose 527, Lactic Acid 4.0  ? ?Name of Provider Notified: Dr. Myrtis Ser   ? ?Orders Received? Or Actions Taken?:  Primary nurse and MD notified.  ?

## 2021-10-16 NOTE — Sepsis Progress Note (Signed)
Notified provider of need to order repeat lactic acid using AMION.  ?

## 2021-10-16 NOTE — ED Triage Notes (Signed)
Pt has developed hypotension and fever after recent urologic procedure. A&O x4. Reports ome mild pain to right abdomen.  ?

## 2021-10-16 NOTE — ED Notes (Signed)
Pt is aware that a urine sample is needed.  

## 2021-10-17 ENCOUNTER — Encounter (HOSPITAL_COMMUNITY): Payer: Self-pay | Admitting: Urology

## 2021-10-17 LAB — GLUCOSE, CAPILLARY
Glucose-Capillary: 390 mg/dL — ABNORMAL HIGH (ref 70–99)
Glucose-Capillary: 381 mg/dL — ABNORMAL HIGH (ref 70–99)
Glucose-Capillary: 311 mg/dL — ABNORMAL HIGH (ref 70–99)
Glucose-Capillary: 271 mg/dL — ABNORMAL HIGH (ref 70–99)
Glucose-Capillary: 222 mg/dL — ABNORMAL HIGH (ref 70–99)
Glucose-Capillary: 213 mg/dL — ABNORMAL HIGH (ref 70–99)
Glucose-Capillary: 215 mg/dL — ABNORMAL HIGH (ref 70–99)
Glucose-Capillary: 259 mg/dL — ABNORMAL HIGH (ref 70–99)
Glucose-Capillary: 277 mg/dL — ABNORMAL HIGH (ref 70–99)
Glucose-Capillary: 303 mg/dL — ABNORMAL HIGH (ref 70–99)

## 2021-10-17 LAB — BLOOD CULTURE ID PANEL (REFLEXED) - BCID2

## 2021-10-17 LAB — CBC WITH DIFFERENTIAL/PLATELET
Abs Immature Granulocytes: 0.01 10*3/uL (ref 0.00–0.07)
Basophils Absolute: 0 10*3/uL (ref 0.0–0.1)
Basophils Relative: 0 %
Eosinophils Absolute: 0 10*3/uL (ref 0.0–0.5)
Eosinophils Relative: 0 %
HCT: 29.6 % — ABNORMAL LOW (ref 36.0–46.0)
Hemoglobin: 9.7 g/dL — ABNORMAL LOW (ref 12.0–15.0)
Immature Granulocytes: 0 %
Lymphocytes Relative: 4 %
Lymphs Abs: 0.4 10*3/uL — ABNORMAL LOW (ref 0.7–4.0)
MCH: 28.6 pg (ref 26.0–34.0)
MCHC: 32.8 g/dL (ref 30.0–36.0)
MCV: 87.3 fL (ref 80.0–100.0)
Monocytes Absolute: 0.5 10*3/uL (ref 0.1–1.0)
Monocytes Relative: 6 %
Neutro Abs: 7.1 10*3/uL (ref 1.7–7.7)
Neutrophils Relative %: 90 %
Platelets: UNDETERMINED 10*3/uL (ref 150–400)
RBC: 3.39 MIL/uL — ABNORMAL LOW (ref 3.87–5.11)
RDW: 13.7 % (ref 11.5–15.5)
WBC: 7.9 10*3/uL (ref 4.0–10.5)
nRBC: 0 % (ref 0.0–0.2)

## 2021-10-17 LAB — COMPREHENSIVE METABOLIC PANEL
ALT: 67 U/L — ABNORMAL HIGH (ref 0–44)
AST: 87 U/L — ABNORMAL HIGH (ref 15–41)
Albumin: 2.6 g/dL — ABNORMAL LOW (ref 3.5–5.0)
Alkaline Phosphatase: 53 U/L (ref 38–126)
Anion gap: 11 (ref 5–15)
BUN: 51 mg/dL — ABNORMAL HIGH (ref 6–20)
CO2: 17 mmol/L — ABNORMAL LOW (ref 22–32)
Calcium: 7.5 mg/dL — ABNORMAL LOW (ref 8.9–10.3)
Chloride: 101 mmol/L (ref 98–111)
Creatinine, Ser: 2.68 mg/dL — ABNORMAL HIGH (ref 0.44–1.00)
GFR, Estimated: 21 mL/min — ABNORMAL LOW (ref >60)
Glucose, Bld: 393 mg/dL — ABNORMAL HIGH (ref 70–99)
Potassium: 4.8 mmol/L (ref 3.5–5.1)
Sodium: 129 mmol/L — ABNORMAL LOW (ref 135–145)
Total Bilirubin: 1.4 mg/dL — ABNORMAL HIGH (ref 0.3–1.2)
Total Protein: 6 g/dL — ABNORMAL LOW (ref 6.5–8.1)

## 2021-10-17 LAB — CBC
HCT: 31.6 % — ABNORMAL LOW (ref 36.0–46.0)
Hemoglobin: 10.4 g/dL — ABNORMAL LOW (ref 12.0–15.0)
MCH: 28.6 pg (ref 26.0–34.0)
MCHC: 32.9 g/dL (ref 30.0–36.0)
MCV: 86.8 fL (ref 80.0–100.0)
Platelets: 98 10*3/uL — ABNORMAL LOW (ref 150–400)
RBC: 3.64 MIL/uL — ABNORMAL LOW (ref 3.87–5.11)
RDW: 13.9 % (ref 11.5–15.5)
WBC: 11.3 10*3/uL — ABNORMAL HIGH (ref 4.0–10.5)
nRBC: 0 % (ref 0.0–0.2)

## 2021-10-17 LAB — PROCALCITONIN: Procalcitonin: >150 ng/mL

## 2021-10-17 LAB — PROTIME-INR
INR: 1.4 — ABNORMAL HIGH (ref 0.8–1.2)
Prothrombin Time: 16.8 seconds — ABNORMAL HIGH (ref 11.4–15.2)

## 2021-10-17 LAB — CORTISOL-AM, BLOOD: Cortisol - AM: 22.2 ug/dL (ref 6.7–22.6)

## 2021-10-17 MED ORDER — ACETAMINOPHEN 325 MG PO TABS
650.0000 mg | ORAL_TABLET | Freq: Four times a day (QID) | ORAL | Status: DC | PRN
  Administered 2021-10-17 – 2021-10-19 (×4): 650 mg via ORAL
  Filled 2021-10-17 (×4): qty 2

## 2021-10-17 MED ORDER — INSULIN REGULAR(HUMAN) IN NACL 100-0.9 UT/100ML-% IV SOLN
INTRAVENOUS | Status: DC
  Administered 2021-10-17: 11.5 [IU]/h via INTRAVENOUS
  Filled 2021-10-17: qty 100

## 2021-10-17 MED ORDER — INSULIN DETEMIR 100 UNIT/ML ~~LOC~~ SOLN
10.0000 [IU] | Freq: Every day | SUBCUTANEOUS | Status: DC
  Administered 2021-10-17 – 2021-10-19 (×3): 10 [IU] via SUBCUTANEOUS
  Filled 2021-10-17 (×3): qty 0.1

## 2021-10-17 MED ORDER — INSULIN ASPART 100 UNIT/ML IJ SOLN: 10.0000 [IU] | INTRAMUSCULAR | Status: AC

## 2021-10-17 MED ORDER — INSULIN ASPART 100 UNIT/ML IJ SOLN
3.0000 [IU] | Freq: Three times a day (TID) | INTRAMUSCULAR | Status: DC
  Administered 2021-10-17 – 2021-10-19 (×6): 3 [IU] via SUBCUTANEOUS

## 2021-10-17 MED ORDER — INSULIN ASPART 100 UNIT/ML IJ SOLN
0.0000 [IU] | Freq: Three times a day (TID) | INTRAMUSCULAR | Status: DC
  Administered 2021-10-17 (×2): 5 [IU] via SUBCUTANEOUS
  Administered 2021-10-18 – 2021-10-19 (×4): 3 [IU] via SUBCUTANEOUS

## 2021-10-17 MED ORDER — SODIUM CHLORIDE 0.9 % IV SOLN
2.0000 g | INTRAVENOUS | Status: DC
  Administered 2021-10-17 – 2021-10-19 (×3): 2 g via INTRAVENOUS
  Filled 2021-10-17 (×3): qty 20

## 2021-10-17 MED ORDER — SODIUM CHLORIDE 0.9 % IV SOLN: 250.0000 mL | INTRAVENOUS | Status: DC

## 2021-10-17 MED ORDER — SODIUM CHLORIDE 0.9 % IV SOLN: 2.0000 g | INTRAVENOUS | Status: DC

## 2021-10-17 MED ORDER — TRAMADOL HCL 50 MG PO TABS
100.0000 mg | ORAL_TABLET | Freq: Two times a day (BID) | ORAL | Status: DC | PRN
  Administered 2021-10-17: 100 mg via ORAL
  Filled 2021-10-17: qty 2

## 2021-10-17 MED ORDER — DEXTROSE IN LACTATED RINGERS 5 % IV SOLN: INTRAVENOUS | Status: DC

## 2021-10-17 MED ORDER — TRAMADOL HCL 50 MG PO TABS: 100.0000 mg | ORAL_TABLET | Freq: Four times a day (QID) | ORAL | Status: DC | PRN

## 2021-10-17 MED ORDER — DEXTROSE 50 % IV SOLN: 0.0000 mL | INTRAVENOUS | Status: DC | PRN

## 2021-10-17 NOTE — Progress Notes (Signed)
1 Day Post-Op ?Subjective: ?Patient reports she is feeling better.  ? ?Objective: ?Vital signs in last 24 hours: ?Temp:  [98 ?F (36.7 ?C)-102.5 ?F (39.2 ?C)] 98.5 ?F (36.9 ?C) (05/13 0800) ?Pulse Rate:  [75-159] 91 (05/13 1000) ?Resp:  [16-40] 19 (05/13 1000) ?BP: (71-192)/(46-104) 114/69 (05/13 1000) ?SpO2:  [92 %-100 %] 99 % (05/13 1000) ?Weight:  [93 kg] 93 kg (05/12 1358) ? ?Intake/Output from previous day: ?05/12 0701 - 05/13 0700 ?In: 5413.1 [I.V.:1063.9; IV Piggyback:4349.2] ?Out: 1350 [id number] ?Intake/Output this shift: ?Total I/O ?In: 37.9 [I.V.:37.9] ?Out: 375 [Urine:375] ? ?Physical Exam:  ?NAD, looking over menu for lunch  ?Urine clear in tubing  ? ?Lab Results: ?Recent Labs  ?  10/16/21 ?1034 10/17/21 ?0242  ?HGB 12.3 9.7*  ?HCT 35.8* 29.6*  ? ?BMET ?Recent Labs  ?  10/16/21 ?1905 10/17/21 ?0242  ?NA 130* 129*  ?K 4.2 4.8  ?CL 99 101  ?CO2 21* 17*  ?GLUCOSE 272* 393*  ?BUN 53* 51*  ?CREATININE 2.61* 2.68*  ?CALCIUM 7.7* 7.5*  ? ?Recent Labs  ?  10/16/21 ?1034 10/17/21 ?0242  ?INR 1.2 1.4*  ? ?No results for input(s): LABURIN in the last 72 hours. ?Results for orders placed or performed during the hospital encounter of 10/16/21  ?Resp Panel by RT-PCR (Flu A&B, Covid) Nasopharyngeal Swab     Status: None  ? Collection Time: 10/16/21 10:34 AM  ? Specimen: Nasopharyngeal Swab; Nasopharyngeal(NP) swabs in vial transport medium  ?Result Value Ref Range Status  ? SARS Coronavirus 2 by RT PCR NEGATIVE NEGATIVE Final  ?  Comment: (NOTE) ?SARS-CoV-2 target nucleic acids are NOT DETECTED. ? ?The SARS-CoV-2 RNA is generally detectable in upper respiratory ?specimens during the acute phase of infection. The lowest ?concentration of SARS-CoV-2 viral copies this assay can detect is ?138 copies/mL. A negative result does not preclude SARS-Cov-2 ?infection and should not be used as the sole basis for treatment or ?other patient management decisions. A negative result may occur with  ?improper specimen  collection/handling, submission of specimen other ?than nasopharyngeal swab, presence of viral mutation(s) within the ?areas targeted by this assay, and inadequate number of viral ?copies(<138 copies/mL). A negative result must be combined with ?clinical observations, patient history, and epidemiological ?information. The expected result is Negative. ? ?Fact Sheet for Patients:  ?EntrepreneurPulse.com.au ? ?Fact Sheet for Healthcare Providers:  ?IncredibleEmployment.be ? ?This test is no t yet approved or cleared by the [place] FDA and  ?has been authorized for detection and/or diagnosis of SARS-CoV-2 by ?FDA under an Emergency Use Authorization (EUA). This EUA will remain  ?in effect (meaning this test can be used) for the duration of the ?COVID-19 declaration under Section 564(b)(1) of the Act, 21 ?U.S.C.section 360bbb-3(b)(1), unless the authorization is terminated  ?or revoked sooner.  ? ? ?  ? Influenza A by PCR NEGATIVE NEGATIVE Final  ? Influenza B by PCR NEGATIVE NEGATIVE Final  ?  Comment: (NOTE) ?The Xpert Xpress SARS-CoV-2/FLU/RSV plus assay is intended as an aid ?in the diagnosis of influenza from Nasopharyngeal swab specimens and ?should not be used as a sole basis for treatment. Nasal washings and ?aspirates are unacceptable for Xpert Xpress SARS-CoV-2/FLU/RSV ?testing. ? ?Fact Sheet for Patients: ?EntrepreneurPulse.com.au ? ?Fact Sheet for Healthcare Providers: ?IncredibleEmployment.be ? ?This test is not yet approved or cleared by the [place] FDA and ?has been authorized for detection and/or diagnosis of SARS-CoV-2 by ?FDA under an Emergency Use Authorization (EUA). This EUA will remain ?in effect (meaning this test  can be used) for the duration of the ?COVID-19 declaration under Section 564(b)(1) of the Act, 21 U.S.C. ?section 360bbb-3(b)(1), unless the authorization is terminated or ?revoked. ? ?Performed at [facility], [place] [address] ?[place], [place] [address] ?  ?Blood Culture (routine x 2)     Status: None (Preliminary result)  ? Collection Time: 10/16/21 10:34 AM  ? Specimen: BLOOD  ?Result Value Ref Range Status  ? Specimen Description   Final  ?  BLOOD RIGHT ANTECUBITAL ?Performed at [facility], [place] [address], [place], [place] [address] ?  ? Special Requests   Final  ?  BOTTLES DRAWN AEROBIC AND ANAEROBIC Blood Culture adequate volume ?Performed at [facility], [place] [address] [place], [place] [address] ?  ? Culture  Setup Time   Final  ?  GRAM NEGATIVE RODS ?IN BOTH AEROBIC AND ANAEROBIC BOTTLES ?CRITICAL VALUE NOTED.  VALUE IS CONSISTENT WITH PREVIOUSLY REPORTED AND CALLED VALUE. ?  ? Culture   Final  ?  GRAM NEGATIVE RODS ?TOO YOUNG TO READ ?Performed at [facility], [place] [address], [place], [place] [address] ?  ? Report Status PENDING  Incomplete  ?Blood Culture (routine x 2)     Status: None (Preliminary result)  ? Collection Time: 10/16/21 10:39 AM  ? Specimen: BLOOD  ?Result Value Ref Range Status  ? Specimen Description   Final  ?  BLOOD LEFT ANTECUBITAL ?Performed at [facility], [place] [address], [place], [place] [address] ?  ? Special Requests   Final  ?  BOTTLES DRAWN AEROBIC AND ANAEROBIC Blood Culture adequate volume ?Performed at [facility], [place] [address], [place], [place] [address] ?  ? Culture  Setup Time   Final  ?  GRAM NEGATIVE RODS ?IN BOTH AEROBIC AND ANAEROBIC BOTTLES ?CRITICAL RESULT CALLED TO, READ BACK BY AND VERIFIED WITH: L POINDEXTER,PHARMD@[id number]  10/17/21 [place] ?  ? Culture   Final  ?  GRAM NEGATIVE RODS ?TOO YOUNG TO READ ?Performed at [facility], [place] [address], [place], [place] [address] ?  ? Report Status PENDING  Incomplete  ?Blood Culture ID Panel (Reflexed)     Status: Abnormal  ? Collection Time: 10/16/21 10:39 AM  ?Result Value Ref Range Status  ? Enterococcus faecalis  NOT DETECTED NOT DETECTED Final  ? Enterococcus Faecium NOT DETECTED NOT DETECTED Final  ? Listeria monocytogenes NOT DETECTED NOT DETECTED Final  ? Staphylococcus species NOT DETECTED NOT DETECTED Final  ? Staphylococcus aureus (BCID) NOT DETECTED NOT DETECTED Final  ? Staphylococcus epidermidis NOT DETECTED NOT DETECTED Final  ? Staphylococcus lugdunensis NOT DETECTED NOT DETECTED Final  ? Streptococcus species NOT DETECTED NOT DETECTED Final  ? Streptococcus agalactiae NOT DETECTED NOT DETECTED Final  ? Streptococcus pneumoniae NOT DETECTED NOT DETECTED Final  ? Streptococcus pyogenes NOT DETECTED NOT DETECTED Final  ? A.calcoaceticus-baumannii NOT DETECTED NOT DETECTED Final  ? Bacteroides fragilis NOT DETECTED NOT DETECTED Final  ? Enterobacterales DETECTED (A) NOT DETECTED Final  ?  Comment: Enterobacterales represent a large order of gram negative bacteria, not a single organism. ?CRITICAL RESULT CALLED TO, READ BACK BY AND VERIFIED WITH: ?L POINDEXTER,PHARMD@[id number]  10/17/21 [place] ?  ? Enterobacter cloacae complex NOT DETECTED NOT DETECTED Final  ? Escherichia coli DETECTED (A) NOT DETECTED Final  ?  Comment: CRITICAL RESULT CALLED TO, READ BACK BY AND VERIFIED WITH: ?L POINDEXTER,[email or web address]@[email or web address]  10/17/21 [place] ?  ? Klebsiella aerogenes NOT DETECTED NOT DETECTED Final  ?  Klebsiella oxytoca NOT DETECTED NOT DETECTED Final  ? Klebsiella pneumoniae NOT DETECTED NOT DETECTED Final  ? Proteus species NOT DETECTED NOT DETECTED Final  ? Salmonella species NOT DETECTED NOT DETECTED Final  ? Serratia marcescens NOT DETECTED NOT DETECTED Final  ? Haemophilus influenzae NOT DETECTED NOT DETECTED Final  ? Neisseria meningitidis NOT DETECTED NOT DETECTED Final  ? Pseudomonas aeruginosa NOT DETECTED NOT DETECTED Final  ? Stenotrophomonas maltophilia NOT DETECTED NOT DETECTED Final  ? Candida albicans NOT DETECTED NOT DETECTED Final  ? Candida auris NOT DETECTED NOT DETECTED Final  ? Candida glabrata NOT DETECTED NOT DETECTED Final  ?  Candida krusei NOT DETECTED NOT DETECTED Final  ? Candida parapsilosis NOT DETECTED NOT DETECTED Final  ? Candida tropicalis NOT DETECTED NOT DETECTED Final  ? Cryptococcus neoformans/gattii NOT DETECTED NOT DETECTED

## 2021-10-17 NOTE — Progress Notes (Signed)
Pt arrive to room from ICU via wheelchair. Pt placed on telemetry. Pt belongings intact. Oriented to room/floor. Pt demonstrates understanding and denies any further needs at this time.  ?

## 2021-10-17 NOTE — Progress Notes (Signed)
PCCM Progress Note ? ?Patient weaned off Neo overnight >6 hours ago. Suspect post-op hypotension +/- septic shock that has resolved. Resolved lactic acidosis. No ICU needs. Did have an insulin gtt started for hyperglycemia. No anion gap. Discussed with primary team. No critical care consultation required. ? ?Pulmonary will sign off. Please call again if needed. ?

## 2021-10-17 NOTE — Progress Notes (Signed)
eLink Physician-Brief Progress Note ?Patient Name: Katherine Gay ?DOB: Oct 20, 1970 ?MRN: [id number] ? ? ?Date of Service ? 10/17/2021  ?HPI/Events of Note ? Climbing blood sugar.  Now 390  ?eICU Interventions ? Starting insulin drip  ? ? ? ?Intervention Category ?Intermediate Interventions: Hyperglycemia - evaluation and treatment ? ?Henry Russel, P ?10/17/2021, 2:57 AM ?

## 2021-10-17 NOTE — Progress Notes (Signed)
PHARMACY - PHYSICIAN COMMUNICATION ?CRITICAL VALUE ALERT - BLOOD CULTURE IDENTIFICATION (BCID) ? ?Katherine Gay is an 51 y.o. female who presented to [facility] on 10/16/2021 with sepsis due to infected renal stone.  S/p ureteral stent placement 5/12 ? ?Assessment:  E. Coli (no resistance) found in 4/4 bottles    ? ?Name of physician (or Provider) ContactedEmeline Darling, FNP ? ?Current antibiotics: Cefepime 2gm IV q24h ? ?Changes to prescribed antibiotics recommended:  ?Recommendations accepted by provider ?D/C Cefepime ?Begin Ceftriaxone 2gm IV q24h ? ?Results for orders placed or performed during the hospital encounter of 10/16/21  ?Blood Culture ID Panel (Reflexed) (Collected: 10/16/2021 10:39 AM)  ?Result Value Ref Range  ? Enterococcus faecalis NOT DETECTED NOT DETECTED  ? Enterococcus Faecium NOT DETECTED NOT DETECTED  ? Listeria monocytogenes NOT DETECTED NOT DETECTED  ? Staphylococcus species NOT DETECTED NOT DETECTED  ? Staphylococcus aureus (BCID) NOT DETECTED NOT DETECTED  ? Staphylococcus epidermidis NOT DETECTED NOT DETECTED  ? Staphylococcus lugdunensis NOT DETECTED NOT DETECTED  ? Streptococcus species NOT DETECTED NOT DETECTED  ? Streptococcus agalactiae NOT DETECTED NOT DETECTED  ? Streptococcus pneumoniae NOT DETECTED NOT DETECTED  ? Streptococcus pyogenes NOT DETECTED NOT DETECTED  ? A.calcoaceticus-baumannii NOT DETECTED NOT DETECTED  ? Bacteroides fragilis NOT DETECTED NOT DETECTED  ? Enterobacterales DETECTED (A) NOT DETECTED  ? Enterobacter cloacae complex NOT DETECTED NOT DETECTED  ? Escherichia coli DETECTED (A) NOT DETECTED  ? Klebsiella aerogenes NOT DETECTED NOT DETECTED  ? Klebsiella oxytoca NOT DETECTED NOT DETECTED  ? Klebsiella pneumoniae NOT DETECTED NOT DETECTED  ? Proteus species NOT DETECTED NOT DETECTED  ? Salmonella species NOT DETECTED NOT DETECTED  ? Serratia marcescens NOT DETECTED NOT DETECTED  ? Haemophilus influenzae NOT DETECTED NOT DETECTED  ? Neisseria meningitidis NOT  DETECTED NOT DETECTED  ? Pseudomonas aeruginosa NOT DETECTED NOT DETECTED  ? Stenotrophomonas maltophilia NOT DETECTED NOT DETECTED  ? Candida albicans NOT DETECTED NOT DETECTED  ? Candida auris NOT DETECTED NOT DETECTED  ? Candida glabrata NOT DETECTED NOT DETECTED  ? Candida krusei NOT DETECTED NOT DETECTED  ? Candida parapsilosis NOT DETECTED NOT DETECTED  ? Candida tropicalis NOT DETECTED NOT DETECTED  ? Cryptococcus neoformans/gattii NOT DETECTED NOT DETECTED  ? CTX-M ESBL NOT DETECTED NOT DETECTED  ? Carbapenem resistance IMP NOT DETECTED NOT DETECTED  ? Carbapenem resistance KPC NOT DETECTED NOT DETECTED  ? Carbapenem resistance NDM NOT DETECTED NOT DETECTED  ? Carbapenem resist OXA 48 LIKE NOT DETECTED NOT DETECTED  ? Carbapenem resistance VIM NOT DETECTED NOT DETECTED  ? ? ?Maryellen Pile, PharmD ?10/17/2021  2:26 AM ? ?

## 2021-10-17 NOTE — Progress Notes (Signed)
?PROGRESS NOTE ? ? ?Katherine Gay  ZOX:[id number] DOB: 1971-01-23 DOA: 10/16/2021 ?PCP: Stamey, Verda CuminsHannah K, FNP  ?Brief Narrative:  ? ?[age] white female known  ?Hallux valgus deformity on the left side, bipolar, type II DM-went to PCP 21 days prior to admission placed on ABX X 3 days-unclear what she took ?Developed R7 right flank pain 5/9+ dysuria + foul-smelling urine + N/V ?-Patient carries an underlying diagnosis of bilateral nephrolithiasis with previous gross hematuria back in 2017 when she saw urologist Dr. Herrick-08/01/2015 had a right UPJ stone and ultimately underwent R 34F X 24 cm ureteric stent placement ? ?ED work-up = WBC 15.2, CBG 311, CT renal stone study 6X 4 cm obstructing R UVJ stone-Rx in ED 1 L saline Toradol 30 Dilaudid 1 Zofran 4 and was scheduled to be discharged home ? ?She was pain-free subsequently [facility]-found to be hypotensive--- endorsed also dizziness nausea vomiting-was not able to manage symptoms with Zofran Norco or Flomax as she was discharged with ? [facility] showing right-sided hydronephrosis ? ?Developed post/perioperatively septic shock requiring 3 L LR vasopressor Neo was started glucose was in the 500s and patient was seen by critical care ?Patient turned around very quickly and is now not hypotensive but remains on the stepdown unit ? ?Hospital-Problem based course ? ?Septic shock on admission 2/2 retained stone?  E. 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 ? ?DVT prophylaxis: SCD ?Code Status: Full ?Family Communication: No family present ?Disposition:  ?Status is: Inpatient ?Remains inpatient appropriate because:  ? ?She had septic shock that is resolving-she will need initiation of insulin may be?-She will need 1 more day being hospitalized to narrow antibiotics and will be going to progressive hopefully later today ?  ?Consultants:  ?Urologist ? ?Procedures: Cystoscopy 5/12 ? ?Antimicrobials: Currently ceftriaxone ? ? ?Subjective: ?Somewhat emotional today-tells me about some of the concerns she has with her mom who has cancer in [place] and also shares that she is going through a separation since March of last year ?Shares with me also that she has been to the wellness center and will be working on a weight loss healthy diet given she is in her own words "addicted to sugar" ?Reflective listening encouraged and try to give her some resources and bolster her confidence ?She feels overall much better than she did-she does not seem "swimmy headed" per her own words ?She has no chest pain ? ?Objective: ?Vitals:  ? 10/17/21 0500 10/17/21 0515 10/17/21 0530 10/17/21 0545  ?BP: 109/76 115/86 108/76 105/75  ?Pulse: 87 90 87 87  ?Resp: (!) 30 (!) 27 (!) 28 (!) 30  ?Temp:      ?TempSrc:      ?  SpO2: 98% 98% 97% 98%  ?Weight:      ?Height:      ? ? ?Intake/Output Summary (Last 24 hours) at 10/17/2021 4503 ?Last data filed at 10/17/2021 8882 ?Gross per 24 hour  ?Intake 5404.74 ml  ?Output 1350 ml  ?Net 4054.74 ml  ? ?Filed Weights  ? 10/16/21 1358  ?Weight: 93 kg  ? ? ?Examination: ? ?EOMI NCAT no focal deficit ?Neck  soft supple ?Abdomen slightly tender in the right lower quadrant ?No rebound no guarding ?No lower extremity edema ?Neurologically grossly intact  ?psych is euthymic ? ? ?Data Reviewed: personally reviewed  ? ?CBC ?   ?Component Value Date/Time  ? WBC 7.9 10/17/2021 0242  ? RBC 3.39 (L) 10/17/2021 0242  ? HGB 9.7 (L) 10/17/2021 0242  ? HCT 29.6 (L) 10/17/2021 0242  ? PLT PLATELET CLUMPS NOTED ON SMEAR, UNABLE TO ESTIMATE 10/17/2021 0242  ? MCV 87.3 10/17/2021 0242  ? MCH 28.6 10/17/2021 0242  ? MCHC 32.8 10/17/2021 0242  ? RDW 13.7 10/17/2021 0242  ? LYMPHSABS 0.4 (L) 10/17/2021 0242  ? MONOABS 0.5 10/17/2021 0242  ? EOSABS 0.0 10/17/2021 0242  ? BASOSABS 0.0 10/17/2021 0242  ? ? ?  Latest Ref Rng & Units 10/17/2021  ?  2:42 AM 10/16/2021  ?  7:05 PM 10/16/2021  ? 10:34 AM  ?CMP  ?Glucose 70 - 99 mg/dL 800   349   179    ?BUN 6 - 20 mg/dL 51   53   52    ?Creatinine 0.44 - 1.00 mg/dL 1.50   5.69   7.94    ?Sodium 135 - 145 mmol/L 129   130   123    ?Potassium 3.5 - 5.1 mmol/L 4.8   4.2   4.8    ?Chloride 98 - 111 mmol/L 101   99   90    ?CO2 22 - 32 mmol/L 17   21   19     ?Calcium 8.9 - 10.3 mg/dL 7.5   7.7   8.0    ?Total Protein 6.5 - 8.1 g/dL 6.0   5.7   7.1    ?Total Bilirubin 0.3 - 1.2 mg/dL 1.4   1.1   1.2    ?Alkaline Phos 38 - 126 U/L 53   53   69    ?AST 15 - 41 U/L 87   48   44    ?ALT 0 - 44 U/L 67   40   46    ? ? ? ?Radiology Studies: ?DG Chest Port 1 View ? ?Result Date: 10/16/2021 ?CLINICAL DATA:  Fever after recent urologic procedure. Questionable sepsis. EXAM: PORTABLE CHEST 1 VIEW COMPARISON:  CT abdomen and pelvis-10/14/2021 FINDINGS: Examination is degraded due to patient body habitus and portable technique. Normal cardiac silhouette and mediastinal contours given reduced lung volumes and AP projection. Minimal bibasilar heterogeneous opacities favored to represent atelectasis. No discrete focal airspace opacities. No pleural effusion or pneumothorax. No evidence of edema. No acute osseous  abnormalities. IMPRESSION: Bibasilar atelectasis without superimposed acute cardiopulmonary disease on this hypoventilated examination. Electronically Signed   By: 12/14/2021 M.D.   On: 10/16/2021 11:07  ? ?DG C-Arm 1-60 Min-No Report ? ?Result Date: 10/16/2021 ?Fluoroscopy was utilized by the requesting physician.  No radiographic interpretation.   ? ? ?Scheduled Meds: ? Chlorhexidine Gluconate Cloth  6 each Topical Daily  ? tamsulosin  0.4 mg Oral QPC breakfast  ? ?Continuous Infusions: ? sodium chloride 10 mL/hr at  10/17/21 0867  ? cefTRIAXone (ROCEPHIN)  IV    ? insulin 11.5 Units/hr (10/17/21 6195)  ? phenylephrine (NEO-SYNEPHRINE) Adult infusion Stopped (10/17/21 0141)  ? ? ? LOS: 1 day  ? ?Time spent: 37 ? ?Rhetta Mura, MD ?[facility] ?To contact the attending provider between 7A-7P or the covering provider during after hours 7P-7A, please log into the web site [email or web address] and access using universal [place] password for that web site. If you do not have the password, please call the hospital operator. ? ?10/17/2021, 7:22 AM  ? ? ?

## 2021-10-18 LAB — GLUCOSE, CAPILLARY
Glucose-Capillary: 217 mg/dL — ABNORMAL HIGH (ref 70–99)
Glucose-Capillary: 240 mg/dL — ABNORMAL HIGH (ref 70–99)
Glucose-Capillary: 226 mg/dL — ABNORMAL HIGH (ref 70–99)
Glucose-Capillary: 171 mg/dL — ABNORMAL HIGH (ref 70–99)

## 2021-10-18 LAB — URINE CULTURE: Culture: 50000 — AB

## 2021-10-18 MED ORDER — METFORMIN HCL 500 MG PO TABS: 1000.0000 mg | ORAL_TABLET | Freq: Two times a day (BID) | ORAL | Status: DC

## 2021-10-18 MED ORDER — SODIUM CHLORIDE 0.9 % IV SOLN: INTRAVENOUS | Status: DC | PRN

## 2021-10-18 MED ORDER — LINAGLIPTIN 5 MG PO TABS
5.0000 mg | ORAL_TABLET | Freq: Every day | ORAL | Status: DC
  Administered 2021-10-18 – 2021-10-19 (×2): 5 mg via ORAL
  Filled 2021-10-18 (×2): qty 1

## 2021-10-18 MED ORDER — SEMAGLUTIDE 7 MG PO TABS: 2.0000 | ORAL_TABLET | Freq: Every day | ORAL | Status: DC

## 2021-10-18 NOTE — Plan of Care (Signed)

## 2021-10-18 NOTE — Progress Notes (Signed)
?   10/18/21 1243  ?Vitals  ?Temp 98.9 ?F (37.2 ?C)  ?Temp Source Oral  ?BP 132/90  ?MAP (mmHg) 102  ?BP Location Right Arm  ?BP Method Automatic  ?Patient Position (if appropriate) Lying  ?Pulse Rate 88  ?Resp 18  ?MEWS COLOR  ?MEWS Score Color Green  ?Oxygen Therapy  ?SpO2 97 %  ?O2 Device Room Air  ?PCA/Epidural/Spinal Assessment  ?Respiratory Pattern Regular;Unlabored  ?MEWS Score  ?MEWS Temp 0  ?MEWS Systolic 0  ?MEWS Pulse 0  ?MEWS RR 0  ?MEWS LOC 0  ?MEWS Score 0  ?Provider Notification  ?Provider Name/Title Dr. Mahala Menghini  ?Date Provider Notified 10/18/21  ?Time Provider Notified 1255  ?Method of Notification Page  ?Notification Reason Change in status  ?Provider response At bedside  ?Date of Provider Response 10/18/21  ?Time of Provider Response 1307  ? ?Pt c/o SOB, dry cough and chest tightness. VSS. Instructed on proper use of IS and encouraged ambulation. Verbalized understanding. Dr. Mahala Menghini paged and at bedside to see patient.  ?

## 2021-10-18 NOTE — Progress Notes (Signed)
?PROGRESS NOTE ? ? ?MEICHELLE READ  [id number] DOB: May 03, 1971 DOA: 10/16/2021 ?PCP: Stamey, Girtha Rm, FNP  ?Brief Narrative:  ? ?35 white female known  ?Hallux valgus deformity on the left side, bipolar, type II DM-went to PCP 21 days prior to admission placed on ABX X 3 days-unclear what she took ?Developed R7 right flank pain 5/9+ dysuria + foul-smelling urine + N/V ?-Patient carries an underlying diagnosis of bilateral nephrolithiasis with previous gross hematuria back in 2017 when she saw urologist Dr. Herrick-08/01/2015 had a right UPJ stone and ultimately underwent R 29F X 24 cm ureteric stent placement ? ?ED work-up = WBC 15.2, CBG 311, CT renal stone study 6X 4 cm obstructing R UVJ stone-Rx in ED 1 L saline Toradol 30 Dilaudid 1 Zofran 4 and was scheduled to be discharged home ? ?She was pain-free subsequently [facility]-found to be hypotensive--- endorsed also dizziness nausea vomiting-was not able to manage symptoms with Zofran Norco or Flomax as she was discharged with ? had an ultrasound at [facility] showing right-sided hydronephrosis ? ?Developed post/perioperatively septic shock requiring 3 L LR vasopressor Neo was started glucose was in the 500s and patient was seen by critical care ?Patient turned around very quickly and is now not hypotensive but remains on the stepdown unit ? ?Hospital-Problem based course ? ?Septic shock on admission 2/2 retained stone?  E. 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 Louis Meckel on this  note as he may need to discuss ?Nephrolithiasis ?Planning, medications, further management deferred to urologist-thank you ?CC Dr. Louis Meckel, Dr. Junious Silk on this note --they will discuss further planning with her ?BMI 33 ?See above discussion with regards to referral to wellness clinic ? ? ?DVT prophylaxis: SCD ?Code Status: Full ?Family Communication: No family present ?Disposition:  ?Status is: Inpatient ?Remains inpatient appropriate because:  ? ?She had septic shock that is resolving-she will need initiation of insulin may be?-She will need 1 more day being hospitalized to narrow antibiotics and will be going to progressive hopefully later today ?  ?Consultants:  ?Urologist ? ?Procedures: Cystoscopy 5/12 ? ?Antimicrobials: Currently ceftriaxone ? ? ?Subjective: ? ?Awake coherent pleasant no distress ?Some mild wheeze today I think she has a little bit of bronchospasm she is not on fluids at this time-I cannot really appreciate any adventitious lung sounds ?She has a friend in the room who is a nurse-we discussed the highlights of the plan with them ? ? ?Objective: ?Vitals:  ? 10/17/21 1900 10/17/21 2002 10/18/21 0446 10/18/21 1243  ?BP:  125/89 126/89 132/90  ?Pulse: 86 86 92 88  ?Resp: (!) 32 18 18 18   ?Temp:  98 ?F (36.7 ?C) 99 ?F (37.2 ?C) 98.9 ?F (37.2 ?C)  ?TempSrc:  Oral Oral Oral  ?SpO2: 97% 98% 97% 97%  ?Weight:      ?Height:      ? ? ?Intake/Output Summary (Last 24 hours) at 10/18/2021 1416 ?Last data filed at 10/18/2021 1000 ?Gross per 24 hour  ?Intake 819.96 ml  ?Output --  ?Net 819.96 ml  ? ? ?  Filed Weights  ? 10/16/21 1358  ?Weight: 93 kg  ? ? ?Examination: ? ?EOMI NCAT no focal deficit ?Neck soft supple ?Chest is clear no wheeze no rales no rhonchi ?S1-S2 slightly tachycardic no murmur ?Abdomen no rebound no guarding ?Neurologically grossly intact  ?psych is euthymic ? ? ?Data Reviewed: personally reviewed  ? ?CBC ?   ?Component Value Date/Time  ? WBC 11.3 (H) 10/17/2021 2018  ? RBC 3.64 (L)  10/17/2021 2018  ? HGB 10.4 (L) 10/17/2021 2018  ? HCT 31.6 (L) 10/17/2021 2018  ? PLT 98 (L) 10/17/2021 2018  ? MCV 86.8 10/17/2021 2018  ? MCH 28.6 10/17/2021 2018  ? MCHC 32.9 10/17/2021 2018  ? RDW 13.9 10/17/2021 2018  ? LYMPHSABS 0.4 (L) 10/17/2021 0242  ? MONOABS 0.5 10/17/2021 0242  ? EOSABS 0.0 10/17/2021 0242  ? BASOSABS 0.0 10/17/2021 0242  ? ? ?  Latest Ref Rng & Units 10/17/2021  ?  2:42 AM 10/16/2021  ?  7:05 PM 10/16/2021  ? 10:34 AM  ?CMP  ?Glucose 70 - 99 mg/dL 393   272   527    ?BUN 6 - 20 mg/dL 51   53   52    ?Creatinine 0.44 - 1.00 mg/dL 2.68   2.61   2.86    ?Sodium 135 - 145 mmol/L 129   130   123    ?Potassium 3.5 - 5.1 mmol/L 4.8   4.2   4.8    ?Chloride 98 - 111 mmol/L 101   99   90    ?CO2 22 - 32 mmol/L 17   21   19     ?Calcium 8.9 - 10.3 mg/dL 7.5   7.7   8.0    ?Total Protein 6.5 - 8.1 g/dL 6.0   5.7   7.1    ?Total Bilirubin 0.3 - 1.2 mg/dL 1.4   1.1   1.2    ?Alkaline Phos 38 - 126 U/L 53   53   69    ?AST 15 - 41 U/L 87   48   44    ?ALT 0 - 44 U/L 67   40   46    ? ? ? ?Radiology Studies: ?DG C-Arm 1-60 Min-No Report ? ?Result Date: 10/16/2021 ?Fluoroscopy was utilized by the requesting physician.  No radiographic interpretation.   ? ? ?Scheduled Meds: ? insulin aspart  0-9 Units Subcutaneous TID WC  ? insulin aspart  10 Units Subcutaneous STAT  ? insulin aspart  3 Units Subcutaneous TID WC  ? insulin detemir  10 Units Subcutaneous Daily  ? tamsulosin  0.4 mg Oral QPC breakfast  ? ?Continuous Infusions: ? sodium chloride Stopped (10/18/21 1013)  ? cefTRIAXone (ROCEPHIN)  IV Stopped (10/18/21 [id number])  ? ? ? LOS: 2 days  ? ?Time spent: 63 ? ?Nita Sells, MD ?[facility] ?To contact the attending provider between 7A-7P or the covering provider during after hours 7P-7A, please log into the web site [email or web address] and access using universal [place] password for that web site. If you do not have the password, please call the hospital operator. ? ?10/18/2021, 2:16 PM  ? ? ?

## 2021-10-19 ENCOUNTER — Encounter: Payer: Self-pay | Admitting: Family Medicine

## 2021-10-19 LAB — COMPREHENSIVE METABOLIC PANEL
ALT: 34 U/L (ref 0–44)
AST: 18 U/L (ref 15–41)
Albumin: 2.5 g/dL — ABNORMAL LOW (ref 3.5–5.0)
Alkaline Phosphatase: 72 U/L (ref 38–126)
Anion gap: 11 (ref 5–15)
BUN: 36 mg/dL — ABNORMAL HIGH (ref 6–20)
CO2: 21 mmol/L — ABNORMAL LOW (ref 22–32)
Calcium: 8.1 mg/dL — ABNORMAL LOW (ref 8.9–10.3)
Chloride: 99 mmol/L (ref 98–111)
Creatinine, Ser: 1.39 mg/dL — ABNORMAL HIGH (ref 0.44–1.00)
GFR, Estimated: 45 mL/min — ABNORMAL LOW (ref >60)
Glucose, Bld: 206 mg/dL — ABNORMAL HIGH (ref 70–99)
Potassium: 3.9 mmol/L (ref 3.5–5.1)
Sodium: 131 mmol/L — ABNORMAL LOW (ref 135–145)
Total Bilirubin: 1.1 mg/dL (ref 0.3–1.2)
Total Protein: 6.4 g/dL — ABNORMAL LOW (ref 6.5–8.1)

## 2021-10-19 LAB — CULTURE, BLOOD (ROUTINE X 2)
Special Requests: ADEQUATE
Special Requests: ADEQUATE

## 2021-10-19 LAB — GLUCOSE, CAPILLARY: Glucose-Capillary: 206 mg/dL — ABNORMAL HIGH (ref 70–99)

## 2021-10-19 LAB — CBC
HCT: 37 % (ref 36.0–46.0)
Hemoglobin: 12.8 g/dL (ref 12.0–15.0)
MCH: 29.6 pg (ref 26.0–34.0)
MCHC: 34.6 g/dL (ref 30.0–36.0)
MCV: 85.5 fL (ref 80.0–100.0)
Platelets: 105 10*3/uL — ABNORMAL LOW (ref 150–400)
RBC: 4.33 MIL/uL (ref 3.87–5.11)
RDW: 13.6 % (ref 11.5–15.5)
WBC: 6.6 10*3/uL (ref 4.0–10.5)
nRBC: 0 % (ref 0.0–0.2)

## 2021-10-19 MED ORDER — LINAGLIPTIN 5 MG PO TABS: 5.0000 mg | ORAL_TABLET | Freq: Every day | ORAL | 1 refills | Status: AC

## 2021-10-19 MED ORDER — RYBELSUS 7 MG PO TABS: 2.0000 | ORAL_TABLET | Freq: Every day | ORAL | 2 refills | Status: AC

## 2021-10-19 MED ORDER — LOSARTAN POTASSIUM 100 MG PO TABS: 50.0000 mg | ORAL_TABLET | Freq: Every day | ORAL | Status: DC

## 2021-10-19 MED ORDER — CEFDINIR 300 MG PO CAPS: 300.0000 mg | ORAL_CAPSULE | Freq: Two times a day (BID) | ORAL | 0 refills | Status: AC

## 2021-10-19 MED ORDER — CEFAZOLIN SODIUM-DEXTROSE 2-4 GM/100ML-% IV SOLN: 2.0000 g | Freq: Three times a day (TID) | INTRAVENOUS | Status: DC

## 2021-10-19 NOTE — Progress Notes (Signed)
Patient discharged home.  Discharge instructions explained, patient verbalizes understanding 

## 2021-10-19 NOTE — Discharge Summary (Signed)
Physician Discharge Summary  ?Katherine Gay QMG:[id number] DOB: 1970-12-04 DOA: 10/16/2021 ? ?PCP: Stamey, Verda Cumins, FNP ? ?Admit date: 10/16/2021 ?Discharge date: 10/19/2021 ? ?Time spent: 45 minutes ? ?Recommendations for Outpatient Follow-up:  ?Get cmet, CBC 1 week ?Consider referral to endocrinology for discussion about Diabetes ?Patient will follow with Urologist ? ?Discharge Diagnoses:  ?MAIN problem for hospitalization  ? ?DKA on admission in the setting of sepsis from nephrolithiasis ? ?Please see below for itemized issues addressed in HOpsital- ?refer to other progress notes for clarity if needed ? ?Discharge Condition: Improved ? ?Diet recommendation: Diabetic ? ?Filed Weights  ? 10/16/21 1358  ?Weight: 93 kg  ? ? ?History of present illness:  ?77 white female known  ?Hallux valgus deformity on the left side, bipolar, type II DM-went to PCP 21 days prior to admission placed on ABX X 3 days-unclear what she took ?Developed R7 right flank pain 5/9+ dysuria + foul-smelling urine + N/V ?-Patient carries an underlying diagnosis of bilateral nephrolithiasis with previous gross hematuria back in 2017 when she saw urologist Dr. Herrick-08/01/2015 had a right UPJ stone and ultimately underwent R 22F X 24 cm ureteric stent placement ?  ?ED work-up = WBC 15.2, CBG 311, CT renal stone study 6X 4 cm obstructing R UVJ stone-Rx in ED 1 L saline Toradol 30 Dilaudid 1 Zofran 4 and was scheduled to be discharged home ?  ?She was pain-free [facility]-found to be hypotensive--- endorsed also dizziness nausea vomiting-was not able to manage symptoms with Zofran Norco or Flomax as she was discharged with ? had an ultrasound at [facility] showing right-sided hydronephrosis ?  ?Developed post/perioperatively septic shock requiring 3 L LR vasopressor Neo was started glucose was in the 500s and patient was seen by critical care ?Patient turned around very quickly and is now not hypotensive but  remains on the stepdown unit ? ?Hospital Course:  ?Septic shock on admission 2/2 retained stone?  E. Coli ?Urine culture growing strep as well as staph epidermidis ?Sepsis physiology resolved quickly ?The E. 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"- ?Patient voices preference to avoid insulin --- we have tried to maximize oral therapy and ?We will continue metformin 1000 twice daily and increase Rybelsus to 14 mg ?I have added also Tradjenta at this hospital stay ?We have counseled her with regards to her continuous glucometer but she does not seem sure about this--- I would recommend referral to endocrinology to discuss this further ?Nephrolithiasis ?Planning, medications, further management deferred to urologist-thank you ?Discussed with urology and we will discharge patient 5/15 with empiric antibiotic attic coverage for 10 to 14 days ?May need definitive management in the outpatient setting ?BMI 33 ?See above discussion with regards to referral to wellness clinic ?  ?Discharge Exam: ?Vitals:  ? 10/18/21 2257 10/19/21 0432  ?BP: 111/80 133/85  ?Pulse: 88 100  ?Resp: 17 18  ?Temp: 98.8 ?F (37.1 ?C) 99.9 ?F (37.7 ?C)  ?SpO2: 99% 95%  ? ? ?Subj on day of d/c ?  ? awake coherent mild cough ?She has no wheeze nor rales or rhonchi ? ?General Exam on discharge ? ?EOMI NCAT pleasant no distress neck soft supple chest clear no rales rhonchi ROM intact moving all 4 limbs equally abdomen soft no lower extremity edema S1-S2 no murmur no rub no gallop ? ?  Discharge Instructions ? ? ?Discharge Instructions   ? ? Diet - low sodium heart healthy   Complete by: As directed ?  ? Discharge instructions   Complete by: As directed ?  ? Please consider getting a glucometer in the outpatient setting it is very reasonable to consider checking your sugars regularly-we will ask the  diabetic coordinator to talk to you about a continuous glucometer which does not require fingersticks-I am hopeful that we can align on you getting blood sugars checked regularly ?You have been prescribed a new medication called Tradjenta which is a DPP-4 inhibitor--- this is in addition to your usual metformin 1000 twice a day and your Rybelsus which we have increased to the 14 mg dosing--- in an ideal world insulin would be the best thing to use given your pretty high sugars when you came in-at your request however I think it is reasonable to talk with your PA and ensure that you get close follow-up and may be an endocrinology referral ?Please follow-up with Dr. Marlou PorchHerrick and call his office in about 2 to 3 days to ensure that you are followed up--we have prescribed you antibiotics for the duration as recommended ?Please ensure also that you follow-up with your other appointments and get labs in the next 1 to 2 weeks ? ?Best of luck with everything  ? Increase activity slowly   Complete by: As directed ?  ? ?  ? ?Allergies as of 10/19/2021   ? ?   Reactions  ? Pollen Extract Other (See Comments)  ? ?  ? ?  ?Medication List  ?  ? ?STOP taking these medications   ? ?diphenhydrAMINE 25 MG tablet ?Commonly known as: BENADRYL ?  ?HYDROcodone-acetaminophen 5-325 MG tablet ?Commonly known as: NORCO/VICODIN ?  ?ondansetron 4 MG tablet ?Commonly known as: ZOFRAN ?  ? ?  ? ?TAKE these medications   ? ?cefdinir 300 MG capsule ?Commonly known as: OMNICEF ?Take 1 capsule (300 mg total) by mouth 2 (two) times daily for 10 days. ?  ?clobetasol ointment 0.05 % ?Commonly known as: TEMOVATE ?Apply 1 application. topically 2 (two) times daily as needed (dermatitis). ?  ?linagliptin 5 MG Tabs tablet ?Commonly known as: TRADJENTA ?Take 1 tablet (5 mg total) by mouth daily. ?Start taking on: Oct 20, 2021 ?  ?losartan 100 MG tablet ?Commonly known as: COZAAR ?Take 0.5 tablets (50 mg total) by mouth daily. ?What changed: how much to take ?   ?metFORMIN 500 MG tablet ?Commonly known as: GLUCOPHAGE ?Take 1,000 mg by mouth 2 (two) times daily with a meal. ?  ?Rybelsus 7 MG Tabs ?Generic drug: Semaglutide ?Take 2 tablets by mouth daily. ?What changed: how much to take ?  ?tamsulosin 0.4 MG Caps capsule ?Commonly known as: FLOMAX ?Take 1 capsule (0.4 mg total) by mouth daily after breakfast. ?  ?triamcinolone cream 0.1 % ?Commonly known as: KENALOG ?Apply 1 application. topically daily as needed for itching. ?  ?valACYclovir 500 MG tablet ?Commonly known as: VALTREX ?Take 1 tablet by mouth daily as needed (flare up). ?  ? ?  ? ?Allergies  ?Allergen Reactions  ? Pollen Extract Other (See Comments)  ? ? Follow-up Information   ? ? Crist FatHerrick, Benjamin W, MD. Call.   ?Specialty: Urology ?Contact information: ?[address] ?[place] [address] ?[phone] ? ? ?  ?  ? ?  ?  ? ?  ? ? ? ?The results of significant diagnostics from this hospitalization (including imaging, microbiology, ancillary and laboratory)  are listed below for reference.   ? ?Significant Diagnostic Studies: ?DG Chest Port 1 View ? ?Result Date: 10/16/2021 ?CLINICAL DATA:  Fever after recent urologic procedure. Questionable sepsis. EXAM: PORTABLE CHEST 1 VIEW COMPARISON:  CT abdomen and pelvis-10/14/2021 FINDINGS: Examination is degraded due to patient body habitus and portable technique. Normal cardiac silhouette and mediastinal contours given reduced lung volumes and AP projection. Minimal bibasilar heterogeneous opacities favored to represent atelectasis. No discrete focal airspace opacities. No pleural effusion or pneumothorax. No evidence of edema. No acute osseous abnormalities. IMPRESSION: Bibasilar atelectasis without superimposed acute cardiopulmonary disease on this hypoventilated examination. Electronically Signed   By: Simonne Come M.D.   On: 10/16/2021 11:07  ? ?DG C-Arm 1-60 Min-No Report ? ?Result Date: 10/16/2021 ?Fluoroscopy was utilized by the requesting physician.  No  radiographic interpretation.  ? ?CT Renal Stone Study ? ?Result Date: 10/14/2021 ?CLINICAL DATA:  Flank pain with kidney stone suspected EXAM: CT ABDOMEN AND PELVIS WITHOUT CONTRAST TECHNIQUE: Multidetector CT imaging of

## 2021-10-19 NOTE — TOC Transition Note (Signed)
Transition of Care (TOC) - CM/SW Discharge Note ? ? ?Patient Details  ?Name: Katherine Gay ?MRN: [id number] ?Date of Birth: 1970/07/01 ? ?Transition of Care [facility]) CM/SW Contact:  ?Golda Acre, RN ?Phone Number: ?10/19/2021, 11:15 AM ? ? ?Clinical Narrative:    ?Patient dcd to home with no toc needs ? ? ?Final next level of care: Home/Self Care ?Barriers to Discharge: No Barriers Identified ? ? ?Patient Goals and CMS Choice ?Patient states their goals for this hospitalization and ongoing recovery are:: to go home ?CMS Medicare.gov Compare Post Acute Care list provided to:: Patient ?  ? ?Discharge Placement ?  ?           ?  ?  ?  ?  ? ?Discharge Plan and Services ?  ?Discharge Planning Services: CM Consult ?           ?  ?  ?  ?  ?  ?  ?  ?  ?  ?  ? ?Social Determinants of Health (SDOH) Interventions ?  ? ? ?Readmission Risk Interventions ?   ? View : No data to display.  ?  ?  ?  ? ? ? ? ? ?

## 2021-10-19 NOTE — Progress Notes (Signed)
Chaplain engaged in an initial visit with Katherine Gay before she discharges today.  Chaplain offered the ministry of reflective listening as [name] shared about her healthcare journey and the many transitions she has been facing recently.  [name] has not only been thinking deeply about the kidney stones in body and the pain she has been in but also the changes in significant relationships, selling her home, caring for her mother who is enduring her own healthcare issues, and being close to her daughter.  Chaplain uplifted all those major things happening in her life.  Chaplain offered a prayer of comfort and clarity over [name].   ? ?[name] is finding hope in joining [employer] that will offer her help with her nutrition and even counseling and therapy resources.  Chaplain affirmed where she is right now in her life and what can exist for her outside of this season.   ? ?Chaplain offered a compassionate presence, a sense of hope, spiritual care through prayer, and support.  ? ? ? 10/19/21 1100  ?Clinical Encounter Type  ?Visited With Patient  ?Visit Type Initial;Spiritual support  ?Referral From Nurse;Patient  ?Consult/Referral To Chaplain  ?Spiritual Encounters  ?Spiritual Needs Prayer  ? ? ?

## 2021-10-20 ENCOUNTER — Emergency Department (HOSPITAL_COMMUNITY)
Admission: EM | Admit: 2021-10-20 | Discharge: 2021-10-21 | Disposition: A | Discharge status: Home / Self Care | Payer: Commercial Managed Care - PPO | Attending: Emergency Medicine | Admitting: Emergency Medicine

## 2021-10-20 ENCOUNTER — Emergency Department (HOSPITAL_COMMUNITY): Payer: Commercial Managed Care - PPO

## 2021-10-20 ENCOUNTER — Encounter (HOSPITAL_COMMUNITY): Payer: Self-pay

## 2021-10-20 ENCOUNTER — Other Ambulatory Visit: Payer: Self-pay

## 2021-10-20 DIAGNOSIS — R509 Fever, unspecified: Secondary | ICD-10-CM

## 2021-10-20 DIAGNOSIS — Z794 Long term (current) use of insulin: Secondary | ICD-10-CM | POA: Diagnosis not present

## 2021-10-20 DIAGNOSIS — E1165 Type 2 diabetes mellitus with hyperglycemia: Secondary | ICD-10-CM | POA: Insufficient documentation

## 2021-10-20 DIAGNOSIS — Z87442 Personal history of urinary calculi: Secondary | ICD-10-CM | POA: Diagnosis not present

## 2021-10-20 DIAGNOSIS — D72829 Elevated white blood cell count, unspecified: Secondary | ICD-10-CM | POA: Diagnosis not present

## 2021-10-20 DIAGNOSIS — Z960 Presence of urogenital implants: Secondary | ICD-10-CM | POA: Diagnosis not present

## 2021-10-20 DIAGNOSIS — R000 Tachycardia, unspecified: Secondary | ICD-10-CM | POA: Diagnosis not present

## 2021-10-20 DIAGNOSIS — R739 Hyperglycemia, unspecified: Secondary | ICD-10-CM

## 2021-10-20 NOTE — ED Triage Notes (Signed)
Pt reports with a fever, she was told to come in if Tylenol did not help. Pt recently had a stent placed and was hospitalized for kidney stones.  ?

## 2021-10-20 NOTE — ED Notes (Signed)
Unsuccessful IV attempt in R AC and R forearm. x2 ?

## 2021-10-21 LAB — CBC WITH DIFFERENTIAL/PLATELET
Abs Immature Granulocytes: 0.52 10*3/uL — ABNORMAL HIGH (ref 0.00–0.07)
Basophils Absolute: 0 10*3/uL (ref 0.0–0.1)
Basophils Relative: 0 %
Eosinophils Absolute: 0.1 10*3/uL (ref 0.0–0.5)
Eosinophils Relative: 1 %
HCT: 30.2 % — ABNORMAL LOW (ref 36.0–46.0)
Hemoglobin: 10.5 g/dL — ABNORMAL LOW (ref 12.0–15.0)
Immature Granulocytes: 5 %
Lymphocytes Relative: 6 %
Lymphs Abs: 0.7 10*3/uL (ref 0.7–4.0)
MCH: 30 pg (ref 26.0–34.0)
MCHC: 34.8 g/dL (ref 30.0–36.0)
MCV: 86.3 fL (ref 80.0–100.0)
Monocytes Absolute: 0.9 10*3/uL (ref 0.1–1.0)
Monocytes Relative: 8 %
Neutro Abs: 9.2 10*3/uL — ABNORMAL HIGH (ref 1.7–7.7)
Neutrophils Relative %: 80 %
Platelets: 181 10*3/uL (ref 150–400)
RBC: 3.5 MIL/uL — ABNORMAL LOW (ref 3.87–5.11)
RDW: 14.1 % (ref 11.5–15.5)
WBC: 11.5 10*3/uL — ABNORMAL HIGH (ref 4.0–10.5)
nRBC: 0 % (ref 0.0–0.2)

## 2021-10-21 LAB — BASIC METABOLIC PANEL
Anion gap: 14 (ref 5–15)
BUN: 22 mg/dL — ABNORMAL HIGH (ref 6–20)
CO2: 17 mmol/L — ABNORMAL LOW (ref 22–32)
Calcium: 7.7 mg/dL — ABNORMAL LOW (ref 8.9–10.3)
Chloride: 100 mmol/L (ref 98–111)
Creatinine, Ser: 1.09 mg/dL — ABNORMAL HIGH (ref 0.44–1.00)
GFR, Estimated: >60 mL/min (ref >60)
Glucose, Bld: 238 mg/dL — ABNORMAL HIGH (ref 70–99)
Potassium: 3.8 mmol/L (ref 3.5–5.1)
Sodium: 131 mmol/L — ABNORMAL LOW (ref 135–145)

## 2021-10-21 LAB — URINALYSIS, ROUTINE W REFLEX MICROSCOPIC
Bacteria, UA: NONE SEEN
Bilirubin Urine: NEGATIVE
Glucose, UA: >=500 mg/dL — AB
Ketones, ur: 80 mg/dL — AB
Nitrite: NEGATIVE
Protein, ur: 100 mg/dL — AB
Specific Gravity, Urine: 1.017 (ref 1.005–1.030)
pH: 5 (ref 5.0–8.0)

## 2021-10-21 LAB — LACTIC ACID, PLASMA
Lactic Acid, Venous: 1.2 mmol/L (ref 0.5–1.9)
Lactic Acid, Venous: 1.3 mmol/L (ref 0.5–1.9)

## 2021-10-21 MED ORDER — SODIUM CHLORIDE 0.9 % IV BOLUS
1000.0000 mL | Freq: Once | INTRAVENOUS | Status: AC
  Administered 2021-10-21: 1000 mL via INTRAVENOUS

## 2021-10-21 MED ORDER — SODIUM CHLORIDE 0.9 % IV SOLN
1.0000 g | Freq: Once | INTRAVENOUS | Status: AC
  Administered 2021-10-21: 1 g via INTRAVENOUS
  Filled 2021-10-21: qty 10

## 2021-10-21 NOTE — ED Notes (Signed)
I provided reinforced discharge education based off of discharge instructions. Pt acknowledged and understood my education. Pt had no further questions/concerns for provider/myself.  °

## 2021-10-21 NOTE — ED Notes (Signed)
Rounded on pt. Pt currently denies any complaints and did not need any further assistance at this time.  ?

## 2021-10-21 NOTE — ED Notes (Signed)
Lab contacted to process added urine culture. ?

## 2021-10-21 NOTE — ED Notes (Signed)
Pt ambulatory without any assistance, pt denied any dizziness/weakness/shortness of breath. ?

## 2021-10-21 NOTE — ED Notes (Signed)
Pt advises being unable to provide urine sample at this time, will monitor. 

## 2021-10-21 NOTE — Discharge Instructions (Signed)
You were seen today for fever.  Your work-up is generally reassuring.  Continue your antibiotic as scheduled and follow-up with urology later today.  If you have worsening pain or any new or worsening symptoms, you should be reevaluated. ?

## 2021-10-21 NOTE — ED Provider Notes (Signed)
?[facility]-EMERGENCY DEPT ?Provider Note ? ? ?CSN: [id number] ?Arrival date & time: 10/20/21  2259 ? ?  ? ?History ? ?Chief Complaint  ?Patient presents with  ? Fever  ? ? ?Katherine Gay is a 51 y.o. female. ? ?HPI ? ?  ? ?This is a 51 year old female who presents with fever.  Patient was recently admitted at the hospital for sepsis secondary to infected ureteral stone.  She was discharged on Omnicef for pansensitive E. coli.  Patient returns today with ongoing fevers.  She had a temperature of 101.3 at home.  She took Tylenol.  Temperature here 99.5.  She states that she was told to return for her temperature.  She states that her pain is fairly well controlled.  No nausea or vomiting.  Denies urinary symptoms.  She has taken 1 dose of antibiotics since discharge.  ? ?Home Medications ?Prior to Admission medications   ?Medication Sig Start Date End Date Taking? Authorizing Provider  ?cefdinir (OMNICEF) 300 MG capsule Take 1 capsule (300 mg total) by mouth 2 (two) times daily for 10 days. 10/19/21 10/29/21  Rhetta MuraSamtani, Jai-Gurmukh, MD  ?clobetasol ointment (TEMOVATE) 0.05 % Apply 1 application. topically 2 (two) times daily as needed (dermatitis). 08/31/21   [provider]  ?linagliptin (TRADJENTA) 5 MG TABS tablet Take 1 tablet (5 mg total) by mouth daily. 10/20/21   Rhetta MuraSamtani, Jai-Gurmukh, MD  ?losartan (COZAAR) 100 MG tablet Take 0.5 tablets (50 mg total) by mouth daily. 10/19/21   Rhetta MuraSamtani, Jai-Gurmukh, MD  ?metFORMIN (GLUCOPHAGE) 500 MG tablet Take 1,000 mg by mouth 2 (two) times daily with a meal.     [provider]  ?RYBELSUS 7 MG TABS Take 2 tablets by mouth daily. 10/19/21   Rhetta MuraSamtani, Jai-Gurmukh, MD  ?tamsulosin (FLOMAX) 0.4 MG CAPS capsule Take 1 capsule (0.4 mg total) by mouth daily after breakfast. 10/14/21   Al DecantGroce, Christopher F, PA-C  ?triamcinolone cream (KENALOG) 0.1 % Apply 1 application. topically daily as needed for itching. 08/19/21   [provider]   ?valACYclovir (VALTREX) 500 MG tablet Take 1 tablet by mouth daily as needed (flare up).    [provider]  ?   ? ?Allergies    ?Pollen extract   ? ?Review of Systems   ?Review of Systems  ?Constitutional:  Positive for fever.  ?Genitourinary:  Negative for dysuria and flank pain.  ?All other systems reviewed and are negative. ? ?Physical Exam ?Updated Vital Signs ?BP 139/76   Pulse 96   Temp 98.4 ?F (36.9 ?C) (Oral)   Resp (!) 29   LMP 03/27/2012   SpO2 99%  ?Physical Exam ?Vitals and nursing note reviewed.  ?Constitutional:   ?   Appearance: She is well-developed. She is not ill-appearing.  ?HENT:  ?   Head: Normocephalic and atraumatic.  ?   Mouth/Throat:  ?   Mouth: Mucous membranes are moist.  ?Eyes:  ?   Pupils: Pupils are equal, round, and reactive to light.  ?Cardiovascular:  ?   Rate and Rhythm: Regular rhythm. Tachycardia present.  ?   Heart sounds: Normal heart sounds.  ?Pulmonary:  ?   Effort: Pulmonary effort is normal. No respiratory distress.  ?   Breath sounds: No wheezing.  ?Abdominal:  ?   Palpations: Abdomen is soft.  ?   Tenderness: There is no abdominal tenderness. There is no guarding or rebound.  ?Musculoskeletal:  ?   Cervical back: Neck supple.  ?Skin: ?   General: Skin is  warm and dry.  ?Neurological:  ?   Mental Status: She is alert and oriented to person, place, and time.  ?Psychiatric:     ?   Mood and Affect: Mood normal.  ? ? ?ED Results / Procedures / Treatments   ?Labs ?(all labs ordered are listed, but only abnormal results are displayed) ?Labs Reviewed  ?CBC WITH DIFFERENTIAL/PLATELET - Abnormal; Notable for the following components:  ?    Result Value  ? WBC 11.5 (*)   ? RBC 3.50 (*)   ? Hemoglobin 10.5 (*)   ? HCT 30.2 (*)   ? Neutro Abs 9.2 (*)   ? Abs Immature Granulocytes 0.52 (*)   ? All other components within normal limits  ?BASIC METABOLIC PANEL - Abnormal; Notable for the following components:  ? Sodium 131 (*)   ? CO2 17 (*)   ? Glucose, Bld 238 (*)   ?  BUN 22 (*)   ? Creatinine, Ser 1.09 (*)   ? Calcium 7.7 (*)   ? All other components within normal limits  ?URINALYSIS, ROUTINE W REFLEX MICROSCOPIC - Abnormal; Notable for the following components:  ? Glucose, UA >=500 (*)   ? Hgb urine dipstick SMALL (*)   ? Ketones, ur 80 (*)   ? Protein, ur 100 (*)   ? Leukocytes,Ua TRACE (*)   ? All other components within normal limits  ?CULTURE, BLOOD (ROUTINE X 2)  ?CULTURE, BLOOD (ROUTINE X 2)  ?URINE CULTURE  ?LACTIC ACID, PLASMA  ?LACTIC ACID, PLASMA  ? ? ?EKG ?None ? ?Radiology ?DG Abdomen 1 View ? ?Result Date: 10/21/2021 ?CLINICAL DATA:  Known ureteral stent and fevers EXAM: ABDOMEN - 1 VIEW COMPARISON:  10/14/2021 FINDINGS: Scattered large and small bowel gas is noted. Right ureteral stent is noted in satisfactory position. No definitive renal or ureteral stones are seen. No bony abnormality is noted. IMPRESSION: Right ureteral stent in place.  No other focal abnormality is noted. Electronically Signed   By: Alcide Clever M.D.   On: 10/21/2021 00:47   ? ?Procedures ?Marland KitchenCritical Care ?Performed by: Shon Baton, MD ?Authorized by: Shon Baton, MD  ? ?Critical care provider statement:  ?  Critical care time (minutes):  30 ?  Critical care was necessary to treat or prevent imminent or life-threatening deterioration of the following conditions:  Dehydration ?  Critical care was time spent personally by me on the following activities:  Development of treatment plan with patient or surrogate, discussions with consultants, evaluation of patient's response to treatment, examination of patient, ordering and review of laboratory studies, ordering and review of radiographic studies, ordering and performing treatments and interventions, pulse oximetry, re-evaluation of patient's condition and review of old charts  ? ? ?Medications Ordered in ED ?Medications  ?sodium chloride 0.9 % bolus 1,000 mL (0 mLs Intravenous Stopped 10/21/21 0146)  ?cefTRIAXone (ROCEPHIN) 1 g in  sodium chloride 0.9 % 100 mL IVPB (0 g Intravenous Stopped 10/21/21 0252)  ?sodium chloride 0.9 % bolus 1,000 mL (0 mLs Intravenous Stopped 10/21/21 0358)  ? ? ?ED Course/ Medical Decision Making/ A&P ?Clinical Course as of 10/21/21 0411  ?Wed Oct 21, 2021  ?[id number] Spoke with Dr. Berneice Heinrich, urology.  He has reviewed the patient's chart.  Patient has outpatient follow-up today.  Given this and that she is clinically stable, feels that she can follow-up closely today. [CH]  ?  ?Clinical Course User Index ?[CH] Sharry Beining, Mayer Masker, MD  ? ?                        ?  Medical Decision Making ?Amount and/or Complexity of Data Reviewed ?Labs: ordered. ?Radiology: ordered. ? ? ?This patient presents to the ED for concern of fever, this involves an extensive number of treatment options, and is a complaint that carries with it a high risk of complications and morbidity.  I considered the following differential and admission for this acute, potentially life threatening condition.  The differential diagnosis includes infected stone, UTI, atelectasis from surgical procedure, sepsis ? ?MDM:   ? ?This is a 51 year old female with recent history of septic shock secondary to infected ureteral stone and stent placement who presents with fever.  Upon arrival here temperature 99.5.  She is tachycardic to 123.  Reports fever at home to 101.3.  She did have an approximately 24-hour delay in getting her oral antibiotics at home.  She is only taken 1 dose.  She is otherwise nontoxic.  She reports that pain has been well controlled.  She is not having any nausea or vomiting.  Labs obtained.  Patient given fluids.  KUB obtained and shows good ureteral stent placement.  Labs notable for uptick in white count to 11.  Previously 6 at discharge.  Additionally, she is hyperglycemic but without anion gap.  Slightly low sodium.  She has 80 ketones in urine likely reflective of dehydration.  Lower suspicion for DKA.  She was given a total of 2 L of fluids.   Patient remained clinically stable.  Lactate is normal.  I discussed with on-call urology.  She has follow-up later today in the office.  Regarding her ongoing hyperglycemia, she was encouraged at discharge to follow-up wi

## 2021-10-21 NOTE — ED Notes (Signed)
Attempted x1 blood draw for second set of cultures without success. Pt having difficulty tolerating venipuncture.  ?

## 2021-10-22 LAB — URINE CULTURE: Culture: NO GROWTH

## 2021-10-26 LAB — CULTURE, BLOOD (ROUTINE X 2)
Culture: NO GROWTH
Special Requests: ADEQUATE

## 2021-10-28 ENCOUNTER — Other Ambulatory Visit: Payer: Self-pay | Admitting: Urology

## 2021-10-30 ENCOUNTER — Inpatient Hospital Stay (HOSPITAL_COMMUNITY)
Admission: EM | Admit: 2021-10-30 | Discharge: 2021-11-01 | DRG: 872 | Disposition: A | Discharge status: Home / Self Care | Payer: Commercial Managed Care - PPO | Attending: Student | Admitting: Student

## 2021-10-30 ENCOUNTER — Encounter (HOSPITAL_COMMUNITY): Payer: Self-pay | Admitting: Emergency Medicine

## 2021-10-30 ENCOUNTER — Other Ambulatory Visit: Payer: Self-pay

## 2021-10-30 ENCOUNTER — Emergency Department (HOSPITAL_COMMUNITY): Payer: Commercial Managed Care - PPO

## 2021-10-30 DIAGNOSIS — E1165 Type 2 diabetes mellitus with hyperglycemia: Secondary | ICD-10-CM | POA: Diagnosis present

## 2021-10-30 DIAGNOSIS — E669 Obesity, unspecified: Secondary | ICD-10-CM | POA: Diagnosis present

## 2021-10-30 DIAGNOSIS — N200 Calculus of kidney: Secondary | ICD-10-CM | POA: Diagnosis present

## 2021-10-30 DIAGNOSIS — Z8619 Personal history of other infectious and parasitic diseases: Secondary | ICD-10-CM

## 2021-10-30 DIAGNOSIS — Z9109 Other allergy status, other than to drugs and biological substances: Secondary | ICD-10-CM

## 2021-10-30 DIAGNOSIS — R6510 Systemic inflammatory response syndrome (SIRS) of non-infectious origin without acute organ dysfunction: Secondary | ICD-10-CM

## 2021-10-30 DIAGNOSIS — Z79899 Other long term (current) drug therapy: Secondary | ICD-10-CM

## 2021-10-30 DIAGNOSIS — N99840 Postprocedural hematoma of a genitourinary system organ or structure following a genitourinary system procedure: Secondary | ICD-10-CM | POA: Diagnosis present

## 2021-10-30 DIAGNOSIS — N390 Urinary tract infection, site not specified: Secondary | ICD-10-CM | POA: Diagnosis not present

## 2021-10-30 DIAGNOSIS — A419 Sepsis, unspecified organism: Secondary | ICD-10-CM

## 2021-10-30 DIAGNOSIS — Z466 Encounter for fitting and adjustment of urinary device: Principal | ICD-10-CM

## 2021-10-30 DIAGNOSIS — F419 Anxiety disorder, unspecified: Secondary | ICD-10-CM | POA: Diagnosis present

## 2021-10-30 DIAGNOSIS — E871 Hypo-osmolality and hyponatremia: Secondary | ICD-10-CM | POA: Diagnosis present

## 2021-10-30 DIAGNOSIS — I10 Essential (primary) hypertension: Secondary | ICD-10-CM | POA: Diagnosis present

## 2021-10-30 DIAGNOSIS — N12 Tubulo-interstitial nephritis, not specified as acute or chronic: Secondary | ICD-10-CM | POA: Diagnosis present

## 2021-10-30 DIAGNOSIS — E8809 Other disorders of plasma-protein metabolism, not elsewhere classified: Secondary | ICD-10-CM | POA: Diagnosis present

## 2021-10-30 DIAGNOSIS — Z7984 Long term (current) use of oral hypoglycemic drugs: Secondary | ICD-10-CM

## 2021-10-30 DIAGNOSIS — Z6833 Body mass index (BMI) 33.0-33.9, adult: Secondary | ICD-10-CM

## 2021-10-30 DIAGNOSIS — Y838 Other surgical procedures as the cause of abnormal reaction of the patient, or of later complication, without mention of misadventure at the time of the procedure: Secondary | ICD-10-CM | POA: Diagnosis present

## 2021-10-30 DIAGNOSIS — D649 Anemia, unspecified: Secondary | ICD-10-CM

## 2021-10-30 DIAGNOSIS — D72825 Bandemia: Secondary | ICD-10-CM

## 2021-10-30 DIAGNOSIS — E119 Type 2 diabetes mellitus without complications: Secondary | ICD-10-CM

## 2021-10-30 LAB — CBC WITH DIFFERENTIAL/PLATELET
Abs Immature Granulocytes: 0.1 10*3/uL — ABNORMAL HIGH (ref 0.00–0.07)
Basophils Absolute: 0.1 10*3/uL (ref 0.0–0.1)
Basophils Relative: 0 %
Eosinophils Absolute: 0 10*3/uL (ref 0.0–0.5)
Eosinophils Relative: 0 %
HCT: 24.9 % — ABNORMAL LOW (ref 36.0–46.0)
Hemoglobin: 8.1 g/dL — ABNORMAL LOW (ref 12.0–15.0)
Immature Granulocytes: 1 %
Lymphocytes Relative: 6 %
Lymphs Abs: 0.8 10*3/uL (ref 0.7–4.0)
MCH: 29.1 pg (ref 26.0–34.0)
MCHC: 32.5 g/dL (ref 30.0–36.0)
MCV: 89.6 fL (ref 80.0–100.0)
Monocytes Absolute: 1 10*3/uL (ref 0.1–1.0)
Monocytes Relative: 8 %
Neutro Abs: 11.5 10*3/uL — ABNORMAL HIGH (ref 1.7–7.7)
Neutrophils Relative %: 85 %
Platelets: 701 10*3/uL — ABNORMAL HIGH (ref 150–400)
RBC: 2.78 MIL/uL — ABNORMAL LOW (ref 3.87–5.11)
RDW: 14.7 % (ref 11.5–15.5)
WBC: 13.4 10*3/uL — ABNORMAL HIGH (ref 4.0–10.5)
nRBC: 0 % (ref 0.0–0.2)

## 2021-10-30 LAB — BASIC METABOLIC PANEL
Anion gap: 8 (ref 5–15)
BUN: 13 mg/dL (ref 6–20)
CO2: 22 mmol/L (ref 22–32)
Calcium: 7.6 mg/dL — ABNORMAL LOW (ref 8.9–10.3)
Chloride: 106 mmol/L (ref 98–111)
Creatinine, Ser: 0.86 mg/dL (ref 0.44–1.00)
GFR, Estimated: >60 mL/min (ref >60)
Glucose, Bld: 300 mg/dL — ABNORMAL HIGH (ref 70–99)
Potassium: 4 mmol/L (ref 3.5–5.1)
Sodium: 136 mmol/L (ref 135–145)

## 2021-10-30 LAB — IRON AND TIBC
Iron: 23 ug/dL — ABNORMAL LOW (ref 28–170)
Saturation Ratios: 11 % (ref 10.4–31.8)
TIBC: 200 ug/dL — ABNORMAL LOW (ref 250–450)
UIBC: 177 ug/dL

## 2021-10-30 LAB — URINALYSIS, ROUTINE W REFLEX MICROSCOPIC
Bilirubin Urine: NEGATIVE
Glucose, UA: >=500 mg/dL — AB
Ketones, ur: 5 mg/dL — AB
Nitrite: NEGATIVE
Protein, ur: 100 mg/dL — AB
RBC / HPF: >50 RBC/hpf — ABNORMAL HIGH (ref 0–5)
Specific Gravity, Urine: 1.016 (ref 1.005–1.030)
WBC, UA: >50 WBC/hpf — ABNORMAL HIGH (ref 0–5)
pH: 5 (ref 5.0–8.0)

## 2021-10-30 LAB — GLUCOSE, CAPILLARY
Glucose-Capillary: 193 mg/dL — ABNORMAL HIGH (ref 70–99)
Glucose-Capillary: 154 mg/dL — ABNORMAL HIGH (ref 70–99)
Glucose-Capillary: 221 mg/dL — ABNORMAL HIGH (ref 70–99)

## 2021-10-30 LAB — ALBUMIN: Albumin: 2.1 g/dL — ABNORMAL LOW (ref 3.5–5.0)

## 2021-10-30 LAB — HEMOGLOBIN AND HEMATOCRIT, BLOOD
HCT: 23.2 % — ABNORMAL LOW (ref 36.0–46.0)
Hemoglobin: 7.3 g/dL — ABNORMAL LOW (ref 12.0–15.0)

## 2021-10-30 LAB — LACTIC ACID, PLASMA: Lactic Acid, Venous: 1.1 mmol/L (ref 0.5–1.9)

## 2021-10-30 MED ORDER — PANTOPRAZOLE SODIUM 40 MG IV SOLR
40.0000 mg | INTRAVENOUS | Status: DC
  Administered 2021-10-30 – 2021-10-31 (×2): 40 mg via INTRAVENOUS
  Filled 2021-10-30 (×2): qty 10

## 2021-10-30 MED ORDER — ONDANSETRON HCL 4 MG/2ML IJ SOLN
INTRAMUSCULAR | Status: AC
  Administered 2021-10-30: 4 mg via INTRAVENOUS
  Filled 2021-10-30: qty 2

## 2021-10-30 MED ORDER — INSULIN ASPART 100 UNIT/ML IJ SOLN
0.0000 [IU] | Freq: Every day | INTRAMUSCULAR | Status: DC
  Administered 2021-10-30: 2 [IU] via SUBCUTANEOUS
  Filled 2021-10-30: qty 0.05

## 2021-10-30 MED ORDER — SODIUM CHLORIDE 0.9 % IV SOLN
1.0000 g | Freq: Once | INTRAVENOUS | Status: AC
  Administered 2021-10-30: 1 g via INTRAVENOUS
  Filled 2021-10-30: qty 10

## 2021-10-30 MED ORDER — ACETAMINOPHEN 650 MG RE SUPP: 650.0000 mg | Freq: Four times a day (QID) | RECTAL | Status: DC | PRN

## 2021-10-30 MED ORDER — ACETAMINOPHEN 325 MG PO TABS
650.0000 mg | ORAL_TABLET | Freq: Four times a day (QID) | ORAL | Status: DC | PRN
  Administered 2021-10-30: 650 mg via ORAL
  Filled 2021-10-30: qty 2

## 2021-10-30 MED ORDER — ONDANSETRON HCL 4 MG/2ML IJ SOLN: 4.0000 mg | Freq: Once | INTRAMUSCULAR | Status: AC

## 2021-10-30 MED ORDER — SODIUM CHLORIDE 0.9 % IV BOLUS
1000.0000 mL | Freq: Once | INTRAVENOUS | Status: AC
  Administered 2021-10-30: 1000 mL via INTRAVENOUS

## 2021-10-30 MED ORDER — SODIUM CHLORIDE 0.9 % IV SOLN: INTRAVENOUS | Status: DC

## 2021-10-30 MED ORDER — CEFTRIAXONE SODIUM 2 G IJ SOLR
2.0000 g | INTRAMUSCULAR | Status: DC
  Administered 2021-10-31 – 2021-11-01 (×2): 2 g via INTRAVENOUS
  Filled 2021-10-30 (×2): qty 20

## 2021-10-30 MED ORDER — PROCHLORPERAZINE EDISYLATE 10 MG/2ML IJ SOLN: 10.0000 mg | Freq: Four times a day (QID) | INTRAMUSCULAR | Status: DC | PRN

## 2021-10-30 MED ORDER — INSULIN ASPART 100 UNIT/ML IJ SOLN
0.0000 [IU] | Freq: Three times a day (TID) | INTRAMUSCULAR | Status: DC
  Administered 2021-10-30 (×2): 3 [IU] via SUBCUTANEOUS
  Administered 2021-10-31 (×2): 5 [IU] via SUBCUTANEOUS
  Administered 2021-10-31: 3 [IU] via SUBCUTANEOUS
  Administered 2021-11-01 (×2): 5 [IU] via SUBCUTANEOUS
  Filled 2021-10-30: qty 0.15

## 2021-10-30 MED ORDER — SODIUM CHLORIDE 0.9 % IV BOLUS
1790.0000 mL | Freq: Once | INTRAVENOUS | Status: AC
  Administered 2021-10-30: 1790 mL via INTRAVENOUS

## 2021-10-30 MED ORDER — HYDRALAZINE HCL 20 MG/ML IJ SOLN: 10.0000 mg | Freq: Four times a day (QID) | INTRAMUSCULAR | Status: DC | PRN

## 2021-10-30 MED ORDER — KETOROLAC TROMETHAMINE 15 MG/ML IJ SOLN
15.0000 mg | Freq: Once | INTRAMUSCULAR | Status: AC
Start: 2021-10-30 — End: 2021-10-30
  Administered 2021-10-30: 15 mg via INTRAVENOUS
  Filled 2021-10-30: qty 1

## 2021-10-30 MED ORDER — SODIUM CHLORIDE 0.9 % IV BOLUS: 30.0000 mL/kg | Freq: Once | INTRAVENOUS | Status: DC

## 2021-10-30 MED ORDER — MORPHINE SULFATE (PF) 4 MG/ML IV SOLN: 4.0000 mg | INTRAVENOUS | Status: DC | PRN

## 2021-10-30 MED ORDER — ONDANSETRON 8 MG PO TBDP: 8.0000 mg | ORAL_TABLET | Freq: Three times a day (TID) | ORAL | 0 refills | Status: AC | PRN

## 2021-10-30 NOTE — Consult Note (Signed)
Consultation: Fever, right renal hematoma Requested by: Dr. Octaviano Glow  History of Present Illness: Katherine Gay is a 51 year old female who underwent stage right ureteroscopy.  She went to urgent stent for ureteral stones on 10/16/2021.  Blood cultures have grown a pansensitive E. coli.  She was then taken Wednesday 10/28/2021 for right ureteroscopy, laser lithotripsy and stent exchange.  Dr. Louis Meckel did this over at [facility].  A stent was left with a string.  She woke up this morning at 0430 and vomited.  She also had a temperature of 101.4.  She had no flank pain.  No gross hematuria.  No dysuria.  No stone passage.  Her white count was 13.4.  Hemoglobin 8.1 down from 10.5.  Creatinine 0.86.  Urinalysis with rare bacteria, greater than 50 white cells, greater than 50 red cells.  She was instructed to remove her stent on Monday morning 11/02/2021 but it was removed this morning in the emergency room.  She said a lot of that was her idea as the stent is a "unnatural" object and she thought it might be contributing to her issues.  She underwent CT scan of the abdomen and pelvis which showed some air and fluid around the right kidney.  An edematous right kidney.  Fluid collection about 2.8 cm.  Some scattered pockets of air.  There was no hydronephrosis and no collecting system or ureteral stone.  She is feeling much better.  She is eating lunch.  Again no flank pain.  Afebrile stable vitals.  She has received Rocephin x2 g.  Past Medical History:  Diagnosis Date   Depression    HSV-2 infection    HTN (hypertension)    Nephrolithiasis    bilateral -- nonobstructive per ct 11-27-2014   Right ureteral stone    Type 2 diabetes mellitus ([place])    Urinary frequency    Past Surgical History:  Procedure Laterality Date   CESAREAN SECTION  2004   CYSTOSCOPY W/ URETERAL STENT PLACEMENT Right 10/16/2021   Procedure: CYSTOSCOPY WITH RETROGRADE PYELOGRAM/URETERAL STENT PLACEMENT;  Surgeon: Festus Aloe, MD;   Location: [facility];  Service: Urology;  Laterality: Right;   CYSTOSCOPY WITH RETROGRADE PYELOGRAM, URETEROSCOPY AND STENT PLACEMENT Right 08/01/2015   Procedure: CYSTOSCOPY WITH RIGHT RETROGRADE PYELOGRAM, URETEROSCOPY AND STENT PLACEMENT;  Surgeon: Ardis Hughs, MD;  Location: [facility];  Service: Urology;  Laterality: Right;   HOLMIUM LASER APPLICATION Right [id number]   Procedure: HOLMIUM LASER APPLICATION;  Surgeon: Ardis Hughs, MD;  [facility];  Service: Urology;  Laterality: Right;   LAPAROSCOPIC CHOLECYSTECTOMY  08-13-2009   WISDOM TOOTH EXTRACTION  2011    Home Medications:  Medications Prior to Admission  Medication Sig Dispense Refill Last Dose   acetaminophen (TYLENOL) 500 MG tablet Take 1,000 mg by mouth See admin instructions. 1000 mg by mouth every 4-6 hours as needed for fever   10/30/2021   Calcium Carbonate Antacid (TUMS PO) Take 1-2 tablets by mouth daily as needed (acid reflux).   unk   CEFDINIR PO Take 1 capsule by mouth in the morning and at bedtime.   10/29/2021   clobetasol ointment (TEMOVATE) [id number] % Apply 1 application. topically daily as needed (dermatitis).   unk   Docosanol (ABREVA EX) Apply 1 application. topically 3 (three) times daily as needed (cold sore).   3 weeks ago   loratadine (CLARITIN) 10 MG tablet Take 10 mg by mouth daily as needed for allergies.   Past Month  losartan (COZAAR) 100 MG tablet Take 0.5 tablets (50 mg total) by mouth daily. (Patient taking differently: Take 100 mg by mouth daily.)   10/29/2021   metFORMIN (GLUCOPHAGE) 500 MG tablet Take 1,000 mg by mouth 2 (two) times daily with a meal.    10/29/2021   ondansetron (ZOFRAN) 4 MG tablet Take 4 mg by mouth daily as needed for nausea.   10/30/2021   RYBELSUS 7 MG TABS Take 2 tablets by mouth daily. (Patient taking differently: Take 7 mg by mouth daily.) 60 tablet 2 10/29/2021   tamsulosin (FLOMAX) 0.4 MG CAPS capsule Take 1 capsule (0.4 mg total)  by mouth daily after breakfast. 30 capsule 0 10/29/2021   triamcinolone cream (KENALOG) 0.1 % Apply 1 application. topically See admin instructions. 1 application 1- times daily as needed for exzema on hands   February   valACYclovir (VALTREX) 500 MG tablet Take 500-1,000 mg by mouth See admin instructions. Take 500-1000mg  by mouth 2-3 times a day as needed for a flare up   3 weeks ago   ibuprofen (ADVIL) 400 MG tablet 1 tablet with food or milk as needed (Patient not taking: Reported on 10/30/2021)   Not Taking   linagliptin (TRADJENTA) 5 MG TABS tablet Take 1 tablet (5 mg total) by mouth daily. (Patient not taking: Reported on 10/30/2021) 30 tablet 1 Not Taking   Allergies:  Allergies  Allergen Reactions   Cat Hair Extract Itching    Itchy throat   Pollen Extract Other (See Comments)    Regular allergies, sinus headache    Family History  Problem Relation Age of Onset   Cancer Maternal Grandmother        BREAST   Social History:  reports that she has never smoked. She has never used smokeless tobacco. She reports current alcohol use. She reports that she does not use drugs.  ROS: A complete review of systems was performed.  All systems are negative except for pertinent findings as noted. Review of Systems  All other systems reviewed and are negative.   Physical Exam:  Vital signs in last 24 hours: Temp:  [98.3 F (36.8 C)-99 F (37.2 C)] 98.3 F (36.8 C) (05/26 1030) Pulse Rate:  [84-119] 88 (05/26 1030) Resp:  [15-25] 18 (05/26 1007) BP: (124-151)/(81-100) 149/94 (05/26 1030) SpO2:  [97 %-100 %] 100 % (05/26 1007) General:  Alert and oriented, No acute distress, eating lunch.  Speaking with nurse. HEENT: Normocephalic, atraumatic Cardiovascular: Regular rate and rhythm Lungs: Regular rate and effort Abdomen: Soft, nontender, nondistended, no abdominal masses Back: No CVA tenderness Extremities: No edema Neurologic: Grossly intact  Laboratory Data:  Results for orders  placed or performed during the hospital encounter of 10/30/21 (from the past 24 hour(s))  Urinalysis, Routine w reflex microscopic     Status: Abnormal   Collection Time: 10/30/21  5:54 AM  Result Value Ref Range   Color, Urine YELLOW YELLOW   APPearance HAZY (A) CLEAR   Specific Gravity, Urine 1.016 1.005 - 1.030   pH 5.0 5.0 - 8.0   Glucose, UA >=500 (A) NEGATIVE mg/dL   Hgb urine dipstick LARGE (A) NEGATIVE   Bilirubin Urine NEGATIVE NEGATIVE   Ketones, ur 5 (A) NEGATIVE mg/dL   Protein, ur 100 (A) NEGATIVE mg/dL   Nitrite NEGATIVE NEGATIVE   Leukocytes,Ua LARGE (A) NEGATIVE   RBC / HPF >50 (H) 0 - 5 RBC/hpf   WBC, UA >50 (H) 0 - 5 WBC/hpf   Bacteria, UA RARE (A) NONE  SEEN   Squamous Epithelial / LPF 11-20 0 - 5   Mucus PRESENT   CBC with Differential     Status: Abnormal   Collection Time: 10/30/21  6:41 AM  Result Value Ref Range   WBC 13.4 (H) 4.0 - 10.5 K/uL   RBC 2.78 (L) 3.87 - 5.11 MIL/uL   Hemoglobin 8.1 (L) 12.0 - 15.0 g/dL   HCT 24.9 (L) 36.0 - 46.0 %   MCV 89.6 80.0 - 100.0 fL   MCH 29.1 26.0 - 34.0 pg   MCHC 32.5 30.0 - 36.0 g/dL   RDW 14.7 11.5 - 15.5 %   Platelets 701 (H) 150 - 400 K/uL   nRBC 0.0 0.0 - 0.2 %   Neutrophils Relative % 85 %   Neutro Abs 11.5 (H) 1.7 - 7.7 K/uL   Lymphocytes Relative 6 %   Lymphs Abs 0.8 0.7 - 4.0 K/uL   Monocytes Relative 8 %   Monocytes Absolute 1.0 0.1 - 1.0 K/uL   Eosinophils Relative 0 %   Eosinophils Absolute 0.0 0.0 - 0.5 K/uL   Basophils Relative 0 %   Basophils Absolute 0.1 0.0 - 0.1 K/uL   Immature Granulocytes 1 %   Abs Immature Granulocytes 0.10 (H) 0.00 - 0.07 K/uL  Basic metabolic panel     Status: Abnormal   Collection Time: 10/30/21  6:41 AM  Result Value Ref Range   Sodium 136 135 - 145 mmol/L   Potassium 4.0 3.5 - 5.1 mmol/L   Chloride 106 98 - 111 mmol/L   CO2 22 22 - 32 mmol/L   Glucose, Bld 300 (H) 70 - 99 mg/dL   BUN 13 6 - 20 mg/dL   Creatinine, Ser 0.86 0.44 - 1.00 mg/dL   Calcium 7.6 (L)  8.9 - 10.3 mg/dL   GFR, Estimated >60 >60 mL/min   Anion gap 8 5 - 15  Albumin     Status: Abnormal   Collection Time: 10/30/21  6:41 AM  Result Value Ref Range   Albumin 2.1 (L) 3.5 - 5.0 g/dL  Lactic acid, plasma     Status: None   Collection Time: 10/30/21  8:52 AM  Result Value Ref Range   Lactic Acid, Venous 1.1 0.5 - 1.9 mmol/L  Glucose, capillary     Status: Abnormal   Collection Time: 10/30/21 11:25 AM  Result Value Ref Range   Glucose-Capillary 193 (H) 70 - 99 mg/dL   Recent Results (from the past 240 hour(s))  Blood culture (routine x 2)     Status: None   Collection Time: 10/20/21 11:59 PM   Specimen: BLOOD  Result Value Ref Range Status   Specimen Description   Final    BLOOD LEFT ANTECUBITAL Performed at [facility], [address] [place], [place] [address]    Special Requests   Final    BOTTLES DRAWN AEROBIC AND ANAEROBIC Blood Culture adequate volume Performed at [place] [address] [place], [place] [address]    Culture   Final    NO GROWTH 5 DAYS Performed at [facility], [place] [address] [place],  [address]    Report Status 10/26/2021 FINAL  Final  Urine Culture     Status: None   Collection Time: 10/21/21  2:13 AM   Specimen: Urine, Clean Catch  Result Value Ref Range Status   Specimen Description   Final    URINE, CLEAN CATCH Performed at [facility], [address] [place], [place] [address]    Special Requests   Final    NONE Performed at [facility], [place] [address], [place], [place] [address]    Culture   Final    NO GROWTH Performed at [facility], [place] [address], [place], [place] [address]    Report Status 10/22/2021 FINAL  Final   Creatinine: Recent Labs    10/30/21 0641  CREATININE 0.86    Impression/Assessment/plan:  51 year old status post right ureteroscopy 48 hours ago with an edematous kidney with some fluid around  it and scattered pockets of air with nausea and low-grade fever:  -Fever-this could be related to infection or small hematoma.  Continue IV antibiotics and follow-up on cultures.  Is no hydronephrosis and I do not think she needs a stent at this point.  In fact she had fever and nausea with the stent.  -Right renal hematoma, air-fluid-this is likely postop changes from ureteroscopy.  No drainable collections and I would not recommend a PERC drain.  This could cause worsening bleeding.  Will follow.  Most patients receive a limited renal ultrasound after ureteroscopy at about 6 weeks and that certainly a good idea in this patient.  -Nausea-improved.  She is eating lunch.  No flank pain.    Festus Aloe 10/30/2021, 1:05 PM

## 2021-10-30 NOTE — H&P (Addendum)
History and Physical    Patient: Katherine Gay DOB: 04/17/1971 DOA: 10/30/2021 DOS: the patient was seen and examined on 10/30/2021 PCP: Stamey, Verda Cumins, FNP  Patient coming from: Home  Chief Complaint:  Chief Complaint  Patient presents with   Fever   HPI: Katherine Gay is a 51 y.o. female with medical history significant of HTN, DM2. Presenting with N/V/fever. She was recently admitted d/t sepsis from obstructive renal stone and E. coli bacteremia. She had a stent placed. She improved on rocephin and was sent home on cefdinir. She reports compliance with that medication and says she has one day left of treatment. In the interim, she was seen in the urology clinic 2 days ago and had lithotripsy. A new stent was placed at that time with instructions to return to clinic next week for exam and removal. She reports early this morning, she woke with N/V. She took her zofran and it seemed to help a little. She reports having "black diarrhea" this morning as well. She didn't have any abdominal or flank pain. She checked her temp and it was 101.4. She took a tylenol, but became concerned d/t to her recent admission for sepsis. So she came to the ED for assistance. She denies any other aggravating or alleviating factors.     Review of Systems: As mentioned in the history of present illness. All other systems reviewed and are negative. Past Medical History:  Diagnosis Date   Depression    HSV-2 infection    HTN (hypertension)    Nephrolithiasis    bilateral -- nonobstructive per ct 11-27-2014   Right ureteral stone    Type 2 diabetes mellitus (HCC)    Urinary frequency    Past Surgical History:  Procedure Laterality Date   CESAREAN SECTION  2004   CYSTOSCOPY W/ URETERAL STENT PLACEMENT Right 10/16/2021   Procedure: CYSTOSCOPY WITH RETROGRADE PYELOGRAM/URETERAL STENT PLACEMENT;  Surgeon: Jerilee Field, MD;  Location: [facility];  Service: Urology;  Laterality: Right;    CYSTOSCOPY WITH RETROGRADE PYELOGRAM, URETEROSCOPY AND STENT PLACEMENT Right 08/01/2015   Procedure: CYSTOSCOPY WITH RIGHT RETROGRADE PYELOGRAM, URETEROSCOPY AND STENT PLACEMENT;  Surgeon: Crist Fat, MD;  Location: [facility];  Service: Urology;  Laterality: Right;   HOLMIUM LASER APPLICATION Right 08/01/2015   Procedure: HOLMIUM LASER APPLICATION;  Surgeon: Crist Fat, MD;  Location: [facility];  Service: Urology;  Laterality: Right;   LAPAROSCOPIC CHOLECYSTECTOMY  08-13-2009   WISDOM TOOTH EXTRACTION  2011   Social History:  reports that she has never smoked. She has never used smokeless tobacco. She reports current alcohol use. She reports that she does not use drugs.  Allergies  Allergen Reactions   Pollen Extract Other (See Comments)    Family History  Problem Relation Age of Onset   Cancer Maternal Grandmother        BREAST    Prior to Admission medications   Medication Sig Start Date End Date Taking? Authorizing Provider  ondansetron (ZOFRAN-ODT) 8 MG disintegrating tablet Take 1 tablet (8 mg total) by mouth every 8 (eight) hours as needed. 10/30/21  Yes Molpus, John, MD  clobetasol ointment (TEMOVATE) 0.05 % Apply 1 application. topically 2 (two) times daily as needed (dermatitis). 08/31/21   [provider]  linagliptin (TRADJENTA) 5 MG TABS tablet Take 1 tablet (5 mg total) by mouth daily. 10/20/21   Rhetta Mura, MD  losartan (COZAAR) 100 MG tablet Take 0.5 tablets (50 mg total) by  mouth daily. 10/19/21   Rhetta Mura, MD  metFORMIN (GLUCOPHAGE) 500 MG tablet Take 1,000 mg by mouth 2 (two) times daily with a meal.     [provider]  RYBELSUS 7 MG TABS Take 2 tablets by mouth daily. 10/19/21   Rhetta Mura, MD  tamsulosin (FLOMAX) 0.4 MG CAPS capsule Take 1 capsule (0.4 mg total) by mouth daily after breakfast. 10/14/21   Al Decant, PA-C  triamcinolone cream (KENALOG) 0.1 % Apply 1  application. topically daily as needed for itching. 08/19/21   [provider]  valACYclovir (VALTREX) 500 MG tablet Take 1 tablet by mouth daily as needed (flare up).    [provider]    Physical Exam: Vitals:   10/30/21 0730 10/30/21 0800 10/30/21 0830 10/30/21 0907  BP: 137/88 124/81 (!) 148/91 (!) 142/83  Pulse: 96 96 98 95  Resp: 15 (!) 25 19 16   Temp:      TempSrc:      SpO2: 100% 97% 100% 100%   General: 51 y.o. female resting in bed in NAD Eyes: PERRL, normal sclera ENMT: Nares patent w/o discharge, orophaynx clear, dentition normal, ears w/o discharge/lesions/ulcers Neck: Supple, trachea midline Cardiovascular: RRR, +S1, S2, no m/g/r, equal pulses throughout Respiratory: CTABL, no w/r/r, normal WOB GI: BS+, NDNT, no masses noted, no organomegaly noted MSK: No e/c/c Neuro: A&O x 3, no focal deficits Psyc: Appropriate interaction and affect, calm/cooperative  Data Reviewed:  Na+  136 K+ 4.0 CO2  22 Glucose  300 Scr  0.86 Ca2+  7.6 WBC  13.4 Hgb  8.1 Plt  701  UA: leuko+, WBC >50  CT renal stone study: pending  Assessment and Plan: UTI SIRS     - place in obs, tele     - recently dx'd w/ e coli bacteremia and has just about completed her course of abx for that; prior to discharge, her rpt bld were negative; we are obtaining Bld Cx for this admission     - her UCx for the previous admission show e coli, staph epi, and strep b; we are obtaining UCx for this admission; spoke with pharmacy/ID pharm about this; reasonable to continue rocephin at 2g q24h; will order an additional 1g for today      - EDP spoke with urology; repeating CT renal study     - fluids, PRN APAP  Normocytic anemia     - we don't have a good baseline for her, but at discharge from last admission, her Hgb was 10.5; she's 8.1 today; she reports dark diarrhea this morning only     - check iron studies and FOBT     - PPI     - trend Hgb, if above positive, will speak with  GI  DM2     - SSI, glucose checks, NPO for now pending CT results  HTN     - PRNs available     - resume home regimen when off NPO status  Hypocalcemia     - her albumin at last admission corrects to a normal range; will rpt albumin now; dependent that result, replace Ca2+  UPDATE:  CT renal study findings: 1. Edematous right kidney with prominence of the right renal collecting system/ureter and urothelial thickening with a perinephric gas and fluid collection measuring up to 2.8 cm in thickness. Findings are concerning for a pyelonephritis with perinephric abscess, correlation with laboratory values and urology consult suggested. 2. Small volume gas in the urinary bladder. 3.  Aortic Atherosclerosis (ICD10-I70.0).  Spoke with Dr. Mena GoesEskridge about the findings. He doesn't feel this is an abscess. It is more likely a hematoma. At this point, we will park her on rocephin 2g and monitor. This also likely explains the Hgb drop. Will check those levels q12 through tomorrow. Urology will check in on her. We appreciate their help.   Advance Care Planning:   Code Status: FULL  Consults: EDP spoke with Urology (Dr. Mena GoesEskridge)  Family Communication: None at bedside  Severity of Illness: The appropriate patient status for this patient is OBSERVATION. Observation status is judged to be reasonable and necessary in order to provide the required intensity of service to ensure the patient's safety. The patient's presenting symptoms, physical exam findings, and initial radiographic and laboratory data in the context of their medical condition is felt to place them at decreased risk for further clinical deterioration. Furthermore, it is anticipated that the patient will be medically stable for discharge from the hospital within 2 midnights of admission.   Author: Teddy Spikeyrone A Alleene Stoy, DO 10/30/2021 9:10 AM  For on call review [email or web address].

## 2021-10-30 NOTE — ED Provider Notes (Signed)
[place] DEPT Provider Note: Georgena Spurling, MD, FACEP  CSN: [id number] MRN: [id number] ARRIVAL: 10/30/21 at 0535 ROOM: WA20/WA20   CHIEF COMPLAINT  Fever   HISTORY OF PRESENT ILLNESS  [id number] AM Katherine Gay is a 51 y.o. female who had a cystoscopy with ureteral stent placement (right) on 10/16/2021.  She was supposed to take her stent out herself Monday (4 days ago) but felt skittish about doing this and still has a stent in place.  She was advised to take a dose of Duricef 1 hour before removing the stent but had actually continue taking her Duricef, last dose yesterday evening at dinner.  She is not having any pain or dysuria associated with the stent.  She awakened about 4 AM today with nausea and vomiting.  She vomited twice.  She took her temperature and found it to be 101.  She took Tylenol but vomited this up.  She then took Zofran which helped relieve her nausea and she was able to hold down a second dose of Tylenol.  Her fever improved and is 99 on arrival.  She has had some loose stools as well but no abdominal pain.   Past Medical History:  Diagnosis Date   Depression    HSV-2 infection    HTN (hypertension)    Nephrolithiasis    bilateral -- nonobstructive per ct 11-27-2014   Right ureteral stone    Type 2 diabetes mellitus ([place])    Urinary frequency     Past Surgical History:  Procedure Laterality Date   CESAREAN SECTION  2004   CYSTOSCOPY W/ URETERAL STENT PLACEMENT Right 10/16/2021   Procedure: CYSTOSCOPY WITH RETROGRADE PYELOGRAM/URETERAL STENT PLACEMENT;  Surgeon: Festus Aloe, MD;  Location: [facility];  Service: Urology;  Laterality: Right;   CYSTOSCOPY WITH RETROGRADE PYELOGRAM, URETEROSCOPY AND STENT PLACEMENT Right 08/01/2015   Procedure: CYSTOSCOPY WITH RIGHT RETROGRADE PYELOGRAM, URETEROSCOPY AND STENT PLACEMENT;  Surgeon: Ardis Hughs, MD;  Location: [facility];  Service: Urology;  Laterality: Right;   HOLMIUM LASER  APPLICATION Right [id number]   Procedure: HOLMIUM LASER APPLICATION;  Surgeon: Ardis Hughs, MD;  Location: [facility];  Service: Urology;  Laterality: Right;   LAPAROSCOPIC CHOLECYSTECTOMY  08-13-2009   WISDOM TOOTH EXTRACTION  2011    Family History  Problem Relation Age of Onset   Cancer Maternal Grandmother        BREAST    Social History   Tobacco Use   Smoking status: Never   Smokeless tobacco: Never  Substance Use Topics   Alcohol use: Yes    Comment: RARE   Drug use: No    Prior to Admission medications   Medication Sig Start Date End Date Taking? Authorizing Provider  clobetasol ointment (TEMOVATE) [id number] % Apply 1 application. topically 2 (two) times daily as needed (dermatitis). 08/31/21   [provider]  linagliptin (TRADJENTA) 5 MG TABS tablet Take 1 tablet (5 mg total) by mouth daily. 10/20/21   Nita Sells, MD  losartan (COZAAR) 100 MG tablet Take 0.5 tablets (50 mg total) by mouth daily. 10/19/21   Nita Sells, MD  metFORMIN (GLUCOPHAGE) 500 MG tablet Take 1,000 mg by mouth 2 (two) times daily with a meal.     [provider]  RYBELSUS 7 MG TABS Take 2 tablets by mouth daily. 10/19/21   Nita Sells, MD  tamsulosin (FLOMAX) 0.4 MG CAPS capsule Take 1 capsule (0.4 mg total) by mouth daily after breakfast. 10/14/21  Azucena Cecil, PA-C  triamcinolone cream (KENALOG) 0.1 % Apply 1 application. topically daily as needed for itching. 08/19/21   [provider]  valACYclovir (VALTREX) 500 MG tablet Take 1 tablet by mouth daily as needed (flare up).    [provider]    Allergies Pollen extract   REVIEW OF SYSTEMS  Negative except as noted here or in the History of Present Illness.   PHYSICAL EXAMINATION  Initial Vital Signs Blood pressure (!) 151/100, pulse (!) 119, temperature 99 F (37.2 C), temperature source Oral, resp. rate 18, last menstrual period 03/27/2012, SpO2 100  %.  Examination General: Well-developed, well-nourished female in no acute distress; appearance consistent with age of record HENT: normocephalic; atraumatic Eyes: Normal appearance Neck: supple Heart: regular rate and rhythm; tachycardia Lungs: clear to auscultation bilaterally Abdomen: soft; nondistended; nontender; bowel sounds present GU: No CVA tenderness;  Extremities: No deformity; full range of motion; pulses normal Neurologic: Awake, alert and oriented; motor function intact in all extremities and symmetric; no facial droop Skin: Warm and dry Psychiatric: Normal mood and affect   RESULTS  Summary of this visit's results, reviewed and interpreted by myself:   EKG Interpretation  Date/Time:    Ventricular Rate:    PR Interval:    QRS Duration:   QT Interval:    QTC Calculation:   R Axis:     Text Interpretation:         Laboratory Studies: Results for orders placed or performed during the hospital encounter of 10/30/21 (from the past 24 hour(s))  Urinalysis, Routine w reflex microscopic     Status: Abnormal   Collection Time: 10/30/21  5:54 AM  Result Value Ref Range   Color, Urine YELLOW YELLOW   APPearance HAZY (A) CLEAR   Specific Gravity, Urine 1.016 1.005 - 1.030   pH 5.0 5.0 - 8.0   Glucose, UA >=500 (A) NEGATIVE mg/dL   Hgb urine dipstick LARGE (A) NEGATIVE   Bilirubin Urine NEGATIVE NEGATIVE   Ketones, ur 5 (A) NEGATIVE mg/dL   Protein, ur 100 (A) NEGATIVE mg/dL   Nitrite NEGATIVE NEGATIVE   Leukocytes,Ua LARGE (A) NEGATIVE   RBC / HPF >50 (H) 0 - 5 RBC/hpf   WBC, UA >50 (H) 0 - 5 WBC/hpf   Bacteria, UA RARE (A) NONE SEEN   Squamous Epithelial / LPF 11-20 0 - 5   Mucus PRESENT    Imaging Studies: No results found.  ED COURSE and MDM  Nursing notes, initial and subsequent vitals signs, including pulse oximetry, reviewed and interpreted by myself.  Vitals:   10/30/21 0545 10/30/21 0647  BP: (!) 151/100 139/87  Pulse: (!) 119 100  Resp: 18  20  Temp: 99 F (37.2 C)   TempSrc: Oral   SpO2: 100% 99%   Medications  sodium chloride 0.9 % bolus 1,000 mL (1,000 mLs Intravenous New Bag/Given 10/30/21 0559)  ketorolac (TORADOL) 15 MG/ML injection 15 mg (15 mg Intravenous Given 10/30/21 0600)  cefTRIAXone (ROCEPHIN) 1 g in sodium chloride 0.9 % 100 mL IVPB (0 g Intravenous Stopped 10/30/21 0639)  ondansetron (ZOFRAN) injection 4 mg (4 mg Intravenous Given 10/30/21 0615)   6:18 AM Urine shows significant pyuria and hematuria but only rare bacteria.  Given that she had a stent in place and is still on antibiotics (cefdinir) I have a low suspicion of an active urinary tract infection.  Urine has been sent for culture in case she has a resistant organism.  Urine culture from  10/21/2021 was negative.  Her nausea, vomiting and diarrhea are consistent with gastroenteritis.  I suspect it was a coincidence that she had not removed her stent as she was having no urinary symptoms.  7:02 AM Signed out to Dr. Langston Masker.  Labs pending.   PROCEDURES  Procedures  STENT REMOVAL After informed verbal consent was obtained the patient was premedicated with 15 mg of IV Toradol to help prevent ureteral spasm.  The patient was then placed in the lithotomy position.  The string to the stent, which had been packed into the vagina, was manually extracted.  The string was then used to withdraw the stent from the urinary tract.  The patient tolerated this well and there were no immediate complications.  1 g of Rocephin IV was started for infection prophylaxis.  ED DIAGNOSES     ICD-10-CM   1. Gastroenteritis  K52.9     2. Encounter for removal of ureteral stent  Z46.6          Jla Reynolds, MD 10/30/21 [phone]

## 2021-10-30 NOTE — ED Provider Notes (Signed)
51 yo female presenting with ureteral stent and retained kidney stone - stent removed by Dr Read Drivers EDP   Received rocephin empirically Urine cx to be sent - UA with leuks, neg nitrites  White blood cell count is showing some abnormalities with a leukocytosis, which may be reactive, however she is also had a two-point drop in her hemoglobin and her platelets are significantly elevated.  This may raise some concern for recurring infection, as she was treated for sepsis nearly a week ago.  She also reports subjective fevers and vomiting since last night.  I think the safest course of action would be to readmit to the hospital with repeat blood cultures, continued antibiotics for sepsis management.  The patient is in agreement with this plan.  Hospitalist paged for admission   Terald Sleeper, MD 10/30/21 [phone]

## 2021-10-30 NOTE — ED Notes (Signed)
EDP at bedside upon pt getting to room and putting in orders now.

## 2021-10-30 NOTE — ED Triage Notes (Signed)
Pt woke with sudden episode of vomiting around 0430. Also had large very soft stool. She just had surgery a few days ago for kidney stones. She took 2 tylenol for her fever. Stent still in place. Denies any flank, kidney pain or surgical site pain.

## 2021-10-30 NOTE — Plan of Care (Signed)

## 2021-10-30 NOTE — ED Notes (Addendum)
Patient tolerated Fluid challenge. Denies N/V.

## 2021-10-31 DIAGNOSIS — Z8619 Personal history of other infectious and parasitic diseases: Secondary | ICD-10-CM | POA: Diagnosis not present

## 2021-10-31 DIAGNOSIS — I10 Essential (primary) hypertension: Secondary | ICD-10-CM | POA: Diagnosis present

## 2021-10-31 DIAGNOSIS — F419 Anxiety disorder, unspecified: Secondary | ICD-10-CM | POA: Diagnosis present

## 2021-10-31 DIAGNOSIS — Z79899 Other long term (current) drug therapy: Secondary | ICD-10-CM | POA: Diagnosis not present

## 2021-10-31 DIAGNOSIS — E669 Obesity, unspecified: Secondary | ICD-10-CM

## 2021-10-31 DIAGNOSIS — A419 Sepsis, unspecified organism: Secondary | ICD-10-CM

## 2021-10-31 DIAGNOSIS — Z6833 Body mass index (BMI) 33.0-33.9, adult: Secondary | ICD-10-CM | POA: Diagnosis not present

## 2021-10-31 DIAGNOSIS — N390 Urinary tract infection, site not specified: Secondary | ICD-10-CM | POA: Diagnosis present

## 2021-10-31 DIAGNOSIS — E871 Hypo-osmolality and hyponatremia: Secondary | ICD-10-CM | POA: Diagnosis present

## 2021-10-31 DIAGNOSIS — N200 Calculus of kidney: Secondary | ICD-10-CM

## 2021-10-31 DIAGNOSIS — Z7984 Long term (current) use of oral hypoglycemic drugs: Secondary | ICD-10-CM | POA: Diagnosis not present

## 2021-10-31 DIAGNOSIS — D72825 Bandemia: Secondary | ICD-10-CM | POA: Diagnosis not present

## 2021-10-31 DIAGNOSIS — E8809 Other disorders of plasma-protein metabolism, not elsewhere classified: Secondary | ICD-10-CM | POA: Diagnosis present

## 2021-10-31 DIAGNOSIS — Y838 Other surgical procedures as the cause of abnormal reaction of the patient, or of later complication, without mention of misadventure at the time of the procedure: Secondary | ICD-10-CM | POA: Diagnosis present

## 2021-10-31 DIAGNOSIS — N99840 Postprocedural hematoma of a genitourinary system organ or structure following a genitourinary system procedure: Secondary | ICD-10-CM | POA: Diagnosis present

## 2021-10-31 DIAGNOSIS — E1165 Type 2 diabetes mellitus with hyperglycemia: Secondary | ICD-10-CM | POA: Diagnosis present

## 2021-10-31 DIAGNOSIS — Z9109 Other allergy status, other than to drugs and biological substances: Secondary | ICD-10-CM | POA: Diagnosis not present

## 2021-10-31 DIAGNOSIS — N12 Tubulo-interstitial nephritis, not specified as acute or chronic: Secondary | ICD-10-CM | POA: Diagnosis present

## 2021-10-31 DIAGNOSIS — D649 Anemia, unspecified: Secondary | ICD-10-CM

## 2021-10-31 LAB — CBC
HCT: 25 % — ABNORMAL LOW (ref 36.0–46.0)
Hemoglobin: 8 g/dL — ABNORMAL LOW (ref 12.0–15.0)
MCH: 28.5 pg (ref 26.0–34.0)
MCHC: 32 g/dL (ref 30.0–36.0)
MCV: 89 fL (ref 80.0–100.0)
Platelets: 635 10*3/uL — ABNORMAL HIGH (ref 150–400)
RBC: 2.81 MIL/uL — ABNORMAL LOW (ref 3.87–5.11)
RDW: 14.7 % (ref 11.5–15.5)
WBC: 11.8 10*3/uL — ABNORMAL HIGH (ref 4.0–10.5)
nRBC: 0 % (ref 0.0–0.2)

## 2021-10-31 LAB — COMPREHENSIVE METABOLIC PANEL
ALT: 9 U/L (ref 0–44)
AST: 12 U/L — ABNORMAL LOW (ref 15–41)
Albumin: 2 g/dL — ABNORMAL LOW (ref 3.5–5.0)
Alkaline Phosphatase: 48 U/L (ref 38–126)
Anion gap: 9 (ref 5–15)
BUN: 8 mg/dL (ref 6–20)
CO2: 20 mmol/L — ABNORMAL LOW (ref 22–32)
Calcium: 7.9 mg/dL — ABNORMAL LOW (ref 8.9–10.3)
Chloride: 104 mmol/L (ref 98–111)
Creatinine, Ser: 0.71 mg/dL (ref 0.44–1.00)
GFR, Estimated: >60 mL/min (ref >60)
Glucose, Bld: 213 mg/dL — ABNORMAL HIGH (ref 70–99)
Potassium: 4.1 mmol/L (ref 3.5–5.1)
Sodium: 133 mmol/L — ABNORMAL LOW (ref 135–145)
Total Bilirubin: 0.5 mg/dL (ref 0.3–1.2)
Total Protein: 5.9 g/dL — ABNORMAL LOW (ref 6.5–8.1)

## 2021-10-31 LAB — GLUCOSE, CAPILLARY
Glucose-Capillary: 229 mg/dL — ABNORMAL HIGH (ref 70–99)
Glucose-Capillary: 174 mg/dL — ABNORMAL HIGH (ref 70–99)
Glucose-Capillary: 212 mg/dL — ABNORMAL HIGH (ref 70–99)
Glucose-Capillary: 186 mg/dL — ABNORMAL HIGH (ref 70–99)

## 2021-10-31 LAB — URINE CULTURE: Culture: 10000 — AB

## 2021-10-31 MED ORDER — INSULIN GLARGINE-YFGN 100 UNIT/ML ~~LOC~~ SOLN
10.0000 [IU] | Freq: Every day | SUBCUTANEOUS | Status: DC
Start: 2021-10-31 — End: 2021-11-01
  Administered 2021-10-31: 10 [IU] via SUBCUTANEOUS
  Filled 2021-10-31 (×2): qty 0.1

## 2021-10-31 MED ORDER — HYDRALAZINE HCL 25 MG PO TABS: 25.0000 mg | ORAL_TABLET | Freq: Four times a day (QID) | ORAL | Status: DC | PRN

## 2021-10-31 MED ORDER — LOSARTAN POTASSIUM 50 MG PO TABS
50.0000 mg | ORAL_TABLET | Freq: Every day | ORAL | Status: DC
  Administered 2021-10-31 – 2021-11-01 (×2): 50 mg via ORAL
  Filled 2021-10-31 (×2): qty 1

## 2021-10-31 NOTE — Assessment & Plan Note (Signed)
Patient was febrile (at home), tachycardic with tachypnea and leukocytosis likely from possible pyelonephritis as noted on CT renal stone study.  However, she has no CVA tenderness or urinary symptoms.  She has history of E. coli bacteremia from urinary source last hospitalization.  Blood cultures NGTD.  Urine reintubated for better gross. -Continue IV ceftriaxone pending urine culture

## 2021-10-31 NOTE — Plan of Care (Signed)

## 2021-10-31 NOTE — Assessment & Plan Note (Addendum)
A1c 9.5% about 2 weeks ago.  On metformin.  Recently started on Rybelsus.  She has not filled Tradjenta yet. Recent Labs  Lab 10/31/21 1139 10/31/21 1730 10/31/21 2154 11/01/21 0731 11/01/21 1138  GLUCAP 174* 212* 186* 217* 216*  -Discharged on home medications.  -Prescribed Lipitor 20 mg daily to start after she completes Diflucan after risk-benefit discussion -Lengthy discussion about the importance of getting her diabetes under good control

## 2021-10-31 NOTE — Assessment & Plan Note (Signed)
Rate and BMP outpatient

## 2021-10-31 NOTE — Assessment & Plan Note (Signed)
Body mass index is 33.07 kg/m. Encourage lifestyle change to lose weight.

## 2021-10-31 NOTE — Assessment & Plan Note (Signed)
S/p stent exchange and lithotripsy 2 days prior to presentation. -Urology following.

## 2021-10-31 NOTE — Hospital Course (Signed)
51 year old F with PMH of HTN, NIDDM-2, anemia, recent hospitalization for obstructive renal stone and E. coli bacteremia for which she was treated with stent placement, IV ceftriaxone and discharged on p.o. cefdinir, and underwent lithotripsy and stent exchange at urology clinic 2 days prior to presentation.   She presented with fever to 101.4, nausea, vomiting and an episode of diarrhea the morning of her presentation.  CT renal stone study concerning for pyelonephritis with possible perinephric abscess.  Cultures obtained.  She was started on IV antibiotics.  Urology consulted, and feels fluid around the right kidney is likely hematoma related to ureteroscopy versus abscess.  Remains on IV antibiotics pending culture.

## 2021-10-31 NOTE — Assessment & Plan Note (Signed)
See sepsis 

## 2021-10-31 NOTE — Assessment & Plan Note (Signed)
Corrects to normal for hypoalbuminemia. ?

## 2021-10-31 NOTE — Assessment & Plan Note (Signed)
Blood pressure elevated partly due to IV fluid, anxiety and holding home losartan. -Discontinue IV fluid.  Nausea, vomiting and diarrhea resolved. -Resume home losartan -Change as needed hydralazine to p.o.

## 2021-10-31 NOTE — Progress Notes (Signed)
Patient ID: Katherine Gay, female   DOB: 04/18/1971, 51 y.o.   MRN: [id number]  S: Patient feeling well.  No flank pain.  No nausea or vomiting.  O: Vitals:   10/30/21 2247 10/31/21 0520  BP: (!) 151/86 (!) 156/87  Pulse: 93 99  Resp: 17 17  Temp: 99.9 F (37.7 C) 99.8 F (37.7 C)  SpO2:     She looks well, talking with her daughter.  A/P:  Postop day 3 right ureteroscopy laser lithotripsy and stent placement-stent removed on 10/30/2021.  CT scan with fluid around the right kidney.    She continues to improve.  This is probably fluid and hematoma related from the ureteroscopy.  Narrow antibiotics based on cultures.  Transfuse as needed.  She has follow-up in our office on 12/03/2021 for renal ultrasound which is appropriate.  She is to call the office if she has any flank pain, fever chills nausea vomiting.  Follow-up on chart.

## 2021-10-31 NOTE — Assessment & Plan Note (Addendum)
Recent Labs    10/14/21 0441 10/16/21 1034 10/17/21 0242 10/17/21 2018 10/19/21 0447 10/20/21 2359 10/30/21 0641 10/30/21 1803 10/31/21 0924 11/01/21 0755  HGB 13.4 12.3 9.7* 10.4* 12.8 10.5* 8.1* 7.3* 8.0* 8.7*  CT renal stone study with fluid around right kidney.  Question about hemorrhage versus abscess.  Overall, Hgb improved after discontinuing IV fluid.  No report of GI bleed.  She is not symptomatic from anemia.  Anemia panel does not suggest iron deficiency. -Recheck CBC at follow-up -Recommend colonoscopy for age-appropriate cancer screening

## 2021-10-31 NOTE — Progress Notes (Signed)
PROGRESS NOTE  Katherine Gay MWN:[id number] DOB: January 30, 1971   PCP: Genia Hotter, FNP  Patient is from: Home.  DOA: 10/30/2021 LOS: 0  Chief complaints Chief Complaint  Patient presents with   Fever     Brief Narrative / Interim history: 51 year old F with PMH of HTN, NIDDM-2, anemia, recent hospitalization for obstructive renal stone and E. coli bacteremia for which she was treated with stent placement, IV ceftriaxone and discharged on p.o. cefdinir, and underwent lithotripsy and stent exchange at urology clinic 2 days prior to presentation.   She presented with fever to 101.4, nausea, vomiting and an episode of diarrhea the morning of her presentation.  CT renal stone study concerning for pyelonephritis with possible perinephric abscess.  Cultures obtained.  She was started on IV antibiotics.  Urology consulted, and feels fluid around the right kidney is likely hematoma related to ureteroscopy versus abscess.  Remains on IV antibiotics pending culture.      Subjective: Seen and examined earlier this morning.  No major events overnight of this morning.  No complaint this morning.  She denies nausea, vomiting, abdominal pain, diarrhea, chest pain, dyspnea, lightheadedness, UTI symptoms or new back pain.  She denies signs of GI bleed.  Has not had a colonoscopy but negative Cologuard's.  Objective: Vitals:   10/30/21 1700 10/30/21 1753 10/30/21 2247 10/31/21 0520  BP:  (!) 156/87 (!) 151/86 (!) 156/87  Pulse:  94 93 99  Resp:  18 17 17   Temp:  99.2 F (37.3 C) 99.9 F (37.7 C) 99.8 F (37.7 C)  TempSrc:  Oral Oral Oral  SpO2:  98%    Weight: 92.9 kg     Height: 5\' 6"  (1.676 m)       Examination:  GENERAL: No apparent distress.  Nontoxic. HEENT: MMM.  Vision and hearing grossly intact.  NECK: Supple.  No apparent JVD.  RESP:  No IWOB.  Fair aeration bilaterally. CVS:  RRR. Heart sounds normal.  ABD/GI/GU: BS+. Abd soft, NTND.  No suprapubic or CVA  tenderness. MSK/EXT:  Moves extremities. No apparent deformity. No edema.  SKIN: no apparent skin lesion or wound NEURO: Awake, alert and oriented appropriately.  No apparent focal neuro deficit. PSYCH: Calm. Normal affect.   Procedures:  None  Microbiology summarized: Blood cultures NGTD. Urine culture pending.  Assessment and Plan: * Sepsis due to complicated UTI Patient was febrile (at home), tachycardic with tachypnea and leukocytosis likely from possible pyelonephritis as noted on CT renal stone study.  However, she has no CVA tenderness or urinary symptoms.  She has history of E. coli bacteremia from urinary source last hospitalization.  Blood cultures NGTD.  Urine reintubated for better gross. -Continue IV ceftriaxone pending urine culture  Normocytic anemia Recent Labs    10/14/21 0441 10/16/21 1034 10/17/21 0242 10/17/21 2018 10/19/21 0447 10/20/21 2359 10/30/21 0641 10/30/21 1803 10/31/21 0924  HGB 13.4 12.3 9.7* 10.4* 12.8 10.5* 8.1* 7.3* 8.0*  CT renal stone study with fluid around right kidney.  Question about perinephric hemorrhage versus abscess.  There could also be some dilutional effect from IV fluid.  No report of GI bleed.  She is not symptomatic from anemia. -Monitor H&H and transfuse for Hgb <7.0.  Verbally consented if indicated -Check anemia panel in the morning   Right nephrolithiasis S/p stent exchange and lithotripsy 2 days prior to presentation. -Urology following.  Uncontrolled NIDDM-2 with hyperglycemia A1c 9.5% about 2 weeks ago.  On Tradjenta, metformin and Rybelsus at home. Recent  Labs  Lab 10/30/21 1125 10/30/21 1627 10/30/21 2207 10/31/21 0742 10/31/21 1139  GLUCAP 193* 154* 221* 229* 174*  -Continue SSI-moderate -Add basal insulin at 10 units daily -Needs to be on a statin.  Check lipid panel in the morning   HTN (hypertension) Blood pressure elevated partly due to IV fluid, anxiety and holding home losartan. -Discontinue IV  fluid.  Nausea, vomiting and diarrhea resolved. -Resume home losartan -Change as needed hydralazine to p.o.  Obesity (BMI 30-39.9) Body mass index is 33.07 kg/m. Encourage lifestyle change to lose weight.  Hypocalcemia Corrects to normal for hypoalbuminemia.  Complicated UTI (urinary tract infection) See sepsis  Hyponatremia Mild.  Monitor.    DVT prophylaxis:  SCDs Start: 10/30/21 1013  Code Status: Full code Family Communication: Patient and/or RN. Available if any question.  Level of care: Telemetry Status is: Observation The patient will require care spanning > 2 midnights and should be moved to inpatient because: Sepsis due to complicated UTI requiring IV antibiotics pending urine culture   Final disposition: Home Consultants:  Urology  Sch Meds:  Scheduled Meds:  insulin aspart  0-15 Units Subcutaneous TID WC   insulin aspart  0-5 Units Subcutaneous QHS   insulin glargine-yfgn  10 Units Subcutaneous Daily   losartan  50 mg Oral Daily   pantoprazole (PROTONIX) IV  40 mg Intravenous Q24H   Continuous Infusions:  cefTRIAXone (ROCEPHIN)  IV 2 g (10/31/21 0853)   PRN Meds:.acetaminophen **OR** acetaminophen, hydrALAZINE, morphine injection, prochlorperazine  Antimicrobials: Anti-infectives (From admission, onward)    Start     Dose/Rate Route Frequency Ordered Stop   10/31/21 1000  cefTRIAXone (ROCEPHIN) 2 g in sodium chloride 0.9 % 100 mL IVPB        2 g 200 mL/hr over 30 Minutes Intravenous Every 24 hours 10/30/21 1013     10/30/21 1200  cefTRIAXone (ROCEPHIN) 1 g in sodium chloride 0.9 % 100 mL IVPB        1 g 200 mL/hr over 30 Minutes Intravenous  Once 10/30/21 1013 10/30/21 1333   10/30/21 0600  cefTRIAXone (ROCEPHIN) 1 g in sodium chloride 0.9 % 100 mL IVPB        1 g 200 mL/hr over 30 Minutes Intravenous  Once 10/30/21 0555 10/30/21 0639        I have personally reviewed the following labs and images: CBC: Recent Labs  Lab 10/30/21 0641  10/30/21 1803 10/31/21 0924  WBC 13.4*  --  11.8*  NEUTROABS 11.5*  --   --   HGB 8.1* 7.3* 8.0*  HCT 24.9* 23.2* 25.0*  MCV 89.6  --  89.0  PLT 701*  --  635*   BMP &GFR Recent Labs  Lab 10/30/21 0641 10/31/21 0354  NA 136 133*  K 4.0 4.1  CL 106 104  CO2 22 20*  GLUCOSE 300* 213*  BUN 13 8  CREATININE 0.86 0.71  CALCIUM 7.6* 7.9*   Estimated Creatinine Clearance: 93.3 mL/min (by C-G formula based on SCr of 0.71 mg/dL). Liver & Pancreas: Recent Labs  Lab 10/30/21 0641 10/31/21 0354  AST  --  12*  ALT  --  9  ALKPHOS  --  48  BILITOT  --  0.5  PROT  --  5.9*  ALBUMIN 2.1* 2.0*   No results for input(s): LIPASE, AMYLASE in the last 168 hours. No results for input(s): AMMONIA in the last 168 hours. Diabetic: No results for input(s): HGBA1C in the last 72 hours. Recent Labs  Lab 10/30/21 1125 10/30/21 1627 10/30/21 2207 10/31/21 0742 10/31/21 1139  GLUCAP 193* 154* 221* 229* 174*   Cardiac Enzymes: No results for input(s): CKTOTAL, CKMB, CKMBINDEX, TROPONINI in the last 168 hours. No results for input(s): PROBNP in the last 8760 hours. Coagulation Profile: No results for input(s): INR, PROTIME in the last 168 hours. Thyroid Function Tests: No results for input(s): TSH, T4TOTAL, FREET4, T3FREE, THYROIDAB in the last 72 hours. Lipid Profile: No results for input(s): CHOL, HDL, LDLCALC, TRIG, CHOLHDL, LDLDIRECT in the last 72 hours. Anemia Panel: Recent Labs    10/30/21 0639  TIBC 200*  IRON 23*   Urine analysis:    Component Value Date/Time   COLORURINE YELLOW 10/30/2021 0554   APPEARANCEUR HAZY (A) 10/30/2021 0554   LABSPEC 1.016 10/30/2021 0554   PHURINE 5.0 10/30/2021 0554   GLUCOSEU >=500 (A) 10/30/2021 0554   HGBUR LARGE (A) 10/30/2021 0554   BILIRUBINUR NEGATIVE 10/30/2021 0554   KETONESUR 5 (A) 10/30/2021 0554   PROTEINUR 100 (A) 10/30/2021 0554   NITRITE NEGATIVE 10/30/2021 0554   LEUKOCYTESUR LARGE (A) 10/30/2021 0554   Sepsis  Labs: Invalid input(s): PROCALCITONIN, LACTICIDVEN  Microbiology: Recent Results (from the past 240 hour(s))  Urine Culture     Status: None (Preliminary result)   Collection Time: 10/30/21  5:54 AM   Specimen: Urine, Clean Catch  Result Value Ref Range Status   Specimen Description   Final    URINE, CLEAN CATCH Performed at [facility], [address] [place], [place] [address]    Special Requests   Final    URINE CUP Performed at [facility], [address] [place], [place] [address]    Culture   Final    CULTURE REINCUBATED FOR BETTER GROWTH Performed at [facility], [address] [place], [place] [address]    Report Status PENDING  Incomplete  Blood culture (routine x 2)     Status: None (Preliminary result)   Collection Time: 10/30/21  8:52 AM   Specimen: BLOOD  Result Value Ref Range Status   Specimen Description   Final    BLOOD LEFT ANTECUBITAL Performed at [facility], [address] [place], [place] [address]    Special Requests   Final    BOTTLES DRAWN AEROBIC AND ANAEROBIC Blood Culture adequate volume Performed at [facility], [address] [place], [place] [address]    Culture   Final    NO GROWTH < 24 HOURS Performed at [facility], [address] [place], [place] [address]    Report Status PENDING  Incomplete    Radiology Studies: No results found.    Sima Lindenberger T. Madgeline Rayo [facility]  If 7PM-7AM, please contact night-coverage [email or web address] 10/31/2021, 12:39 PM

## 2021-11-01 DIAGNOSIS — D72825 Bandemia: Secondary | ICD-10-CM | POA: Diagnosis not present

## 2021-11-01 DIAGNOSIS — N390 Urinary tract infection, site not specified: Secondary | ICD-10-CM | POA: Diagnosis not present

## 2021-11-01 DIAGNOSIS — I10 Essential (primary) hypertension: Secondary | ICD-10-CM | POA: Diagnosis not present

## 2021-11-01 DIAGNOSIS — A419 Sepsis, unspecified organism: Secondary | ICD-10-CM | POA: Diagnosis not present

## 2021-11-01 LAB — RENAL FUNCTION PANEL
Albumin: 2.3 g/dL — ABNORMAL LOW (ref 3.5–5.0)
Anion gap: 8 (ref 5–15)
BUN: 6 mg/dL (ref 6–20)
CO2: 24 mmol/L (ref 22–32)
Calcium: 8.5 mg/dL — ABNORMAL LOW (ref 8.9–10.3)
Chloride: 104 mmol/L (ref 98–111)
Creatinine, Ser: 0.9 mg/dL (ref 0.44–1.00)
GFR, Estimated: >60 mL/min (ref >60)
Glucose, Bld: 213 mg/dL — ABNORMAL HIGH (ref 70–99)
Phosphorus: 3.6 mg/dL (ref 2.5–4.6)
Potassium: 4 mmol/L (ref 3.5–5.1)
Sodium: 136 mmol/L (ref 135–145)

## 2021-11-01 LAB — CBC WITH DIFFERENTIAL/PLATELET
Abs Immature Granulocytes: 0.06 10*3/uL (ref 0.00–0.07)
Basophils Absolute: 0 10*3/uL (ref 0.0–0.1)
Basophils Relative: 0 %
Eosinophils Absolute: 0.1 10*3/uL (ref 0.0–0.5)
Eosinophils Relative: 1 %
HCT: 26.7 % — ABNORMAL LOW (ref 36.0–46.0)
Hemoglobin: 8.7 g/dL — ABNORMAL LOW (ref 12.0–15.0)
Immature Granulocytes: 1 %
Lymphocytes Relative: 12 %
Lymphs Abs: 1.3 10*3/uL (ref 0.7–4.0)
MCH: 28.7 pg (ref 26.0–34.0)
MCHC: 32.6 g/dL (ref 30.0–36.0)
MCV: 88.1 fL (ref 80.0–100.0)
Monocytes Absolute: 0.9 10*3/uL (ref 0.1–1.0)
Monocytes Relative: 9 %
Neutro Abs: 8.4 10*3/uL — ABNORMAL HIGH (ref 1.7–7.7)
Neutrophils Relative %: 77 %
Platelets: 706 10*3/uL — ABNORMAL HIGH (ref 150–400)
RBC: 3.03 MIL/uL — ABNORMAL LOW (ref 3.87–5.11)
RDW: 14.6 % (ref 11.5–15.5)
WBC: 10.7 10*3/uL — ABNORMAL HIGH (ref 4.0–10.5)
nRBC: 0 % (ref 0.0–0.2)

## 2021-11-01 LAB — GLUCOSE, CAPILLARY
Glucose-Capillary: 217 mg/dL — ABNORMAL HIGH (ref 70–99)
Glucose-Capillary: 216 mg/dL — ABNORMAL HIGH (ref 70–99)

## 2021-11-01 LAB — MAGNESIUM: Magnesium: 1.5 mg/dL — ABNORMAL LOW (ref 1.7–2.4)

## 2021-11-01 MED ORDER — INSULIN GLARGINE-YFGN 100 UNIT/ML ~~LOC~~ SOLN
15.0000 [IU] | Freq: Every day | SUBCUTANEOUS | Status: DC
  Administered 2021-11-01: 15 [IU] via SUBCUTANEOUS
  Filled 2021-11-01: qty 0.15

## 2021-11-01 MED ORDER — PANTOPRAZOLE SODIUM 40 MG PO TBEC: 40.0000 mg | DELAYED_RELEASE_TABLET | Freq: Every day | ORAL | 0 refills | Status: DC

## 2021-11-01 MED ORDER — ATORVASTATIN CALCIUM 20 MG PO TABS: 20.0000 mg | ORAL_TABLET | Freq: Every day | ORAL | 3 refills | Status: AC

## 2021-11-01 MED ORDER — CEFADROXIL 1 G PO TABS: 1.0000 g | ORAL_TABLET | Freq: Two times a day (BID) | ORAL | 0 refills | Status: AC

## 2021-11-01 MED ORDER — LOSARTAN POTASSIUM 50 MG PO TABS
50.0000 mg | ORAL_TABLET | Freq: Once | ORAL | Status: AC
  Administered 2021-11-01: 50 mg via ORAL
  Filled 2021-11-01: qty 1

## 2021-11-01 MED ORDER — FLUCONAZOLE 100 MG PO TABS
200.0000 mg | ORAL_TABLET | Freq: Every day | ORAL | Status: DC
  Administered 2021-11-01: 200 mg via ORAL
  Filled 2021-11-01: qty 2

## 2021-11-01 MED ORDER — LOSARTAN POTASSIUM 100 MG PO TABS: 100.0000 mg | ORAL_TABLET | Freq: Every day | ORAL | 1 refills | Status: AC

## 2021-11-01 MED ORDER — FLUCONAZOLE 200 MG PO TABS: 200.0000 mg | ORAL_TABLET | Freq: Every day | ORAL | 0 refills | Status: DC

## 2021-11-01 MED ORDER — PANTOPRAZOLE SODIUM 40 MG PO TBEC: 40.0000 mg | DELAYED_RELEASE_TABLET | Freq: Every day | ORAL | Status: DC

## 2021-11-01 MED ORDER — LOSARTAN POTASSIUM 50 MG PO TABS: 100.0000 mg | ORAL_TABLET | Freq: Every day | ORAL | Status: DC

## 2021-11-01 NOTE — Progress Notes (Signed)
PHARMACIST - PHYSICIAN COMMUNICATION   CONCERNING: IV to Oral Route Change Policy  RECOMMENDATION: This patient is receiving pantoprazole by the intravenous route.  Based on criteria approved by the Pharmacy and Therapeutics Committee, the intravenous medication(s) is/are being converted to the equivalent oral dose form(s).   DESCRIPTION: These criteria include: The patient is eating (either orally or via tube) and/or has been taking other orally administered medications for a least 24 hours The patient has no evidence of active gastrointestinal bleeding or impaired GI absorption (gastrectomy, short bowel, patient on TNA or NPO).  If you have questions about this conversion, please contact the Pharmacy Department    Pricilla Riffle, PharmD, BCPS Clinical Pharmacist 11/01/2021 9:36 AM

## 2021-11-01 NOTE — Discharge Summary (Addendum)
Physician Discharge Summary  Katherine Gay ZOX:[id number] DOB: [birth year] DOA: 10/30/2021  PCP: Genia HotterStamey, Hannah K, FNP  Admit date: 10/30/2021 Discharge date: 11/01/2021 Admitted From: Home Disposition: Home Recommendations for Outpatient Follow-up:  Follow ups as below. Please obtain CBC/BMP/Mag at follow up Reassess blood pressure and adjust antihypertensive meds as appropriate Please follow up on the following pending results: Final result on urine culture  Home Health: Not indicated Equipment/Devices: Not indicated  Discharge Condition: Stable CODE STATUS: Full code  Follow-up Information     Crist FatHerrick, Benjamin W, MD Follow up on 12/03/2021.   Specialty: Urology Why: at 2 PM for renal US.  Call the office if you develop any recurrent fever, nausea / vomiting, or pain. Contact information: [address] [place] [address] [phone]         Stamey, Verda CuminsHannah K, FNP. Schedule an appointment as soon as possible for a visit in 1 week(s).   Specialty: Family Medicine Contact information: [address] [place] [address] [phone]                 Hospital course 51 year old F with PMH of HTN, NIDDM-2, anemia, recent hospitalization for obstructive renal stone and E. coli bacteremia for which she was treated with stent placement, IV ceftriaxone and discharged on p.o. cefdinir, and underwent lithotripsy and stent exchange at urology clinic 2 days prior to presentation.   She presented with fever to 101.4, nausea, vomiting and an episode of diarrhea the morning of her presentation.  CT renal stone study concerning for pyelonephritis with possible perinephric abscess.  Cultures obtained.  She was started on IV antibiotics.  Urology consulted, and feels fluid around the right kidney is likely hematoma related to ureteroscopy versus abscess.  Patient's symptoms resolved.  Hemoglobin remained stable.  Urine culture with 10,000 yeast.  She was started on Diflucan.   She was cleared for discharge by urology on oral antibiotics and Diflucan for outpatient follow-up.  Accordingly, she is discharged on cefadroxil 1 g twice daily for 8 more days and Diflucan 200 mg daily for a total of 2 weeks.  Urology to arrange outpatient follow-up.    See individual problem list below for more on hospital course.  Problems addressed during this hospitalization Problem  Sepsis due to complicated UTI  Normocytic Anemia  Right Nephrolithiasis  Uncontrolled NIDDM-2 with hyperglycemia  Htn (Hypertension)  Obesity (Bmi 30-39.9)  Complicated Uti (Urinary Tract Infection)  Hypocalcemia  Hyponatremia    Assessment and Plan: * Sepsis due to complicated UTI Febrile (at home), tachycardic with tachypnea and leukocytosis likely from possible pyelonephritis as noted on CT renal stone study.  However, she has no CVA tenderness or urinary symptoms.  She has history of E. coli bacteremia likely from urinary source although urine culture showed staph epidermis last hospitalization.  Blood cultures NGTD this hospitalization.  Urine culture with 10,000 colonies of yeast.  Sepsis physiology resolving.  She feels well and eager to go home. -P.o. cefadroxil 1 g twice daily for 8 more days to complete 10 days course -P.o. Diflucan 200 mg daily for 2 weeks -Outpatient follow-up with urology  Normocytic anemia Recent Labs    10/14/21 0441 10/16/21 1034 10/17/21 0242 10/17/21 2018 10/19/21 0447 10/20/21 2359 10/30/21 0641 10/30/21 1803 10/31/21 0924 11/01/21 0755  HGB 13.4 12.3 9.7* 10.4* 12.8 10.5* 8.1* 7.3* 8.0* 8.7*  CT renal stone study with fluid around right kidney.  Question about hemorrhage versus abscess.  Overall, Hgb improved  after discontinuing IV fluid.  No report of GI bleed.  She is not symptomatic from anemia.  Anemia panel does not suggest iron deficiency. -Recheck CBC at follow-up -Recommend colonoscopy for age-appropriate cancer screening   Right  nephrolithiasis S/p stent exchange and lithotripsy 2 days prior to presentation. -Per urology.  Uncontrolled NIDDM-2 with hyperglycemia A1c 9.5% about 2 weeks ago.  On metformin.  Recently started on Rybelsus.  She has not filled Tradjenta yet. Recent Labs  Lab 10/31/21 1139 10/31/21 1730 10/31/21 2154 11/01/21 0731 11/01/21 1138  GLUCAP 174* 212* 186* 217* 216*  -Discharged on home medications.  -Prescribed Lipitor 20 mg daily to start after she completes Diflucan after risk-benefit discussion -Lengthy discussion about the importance of getting her diabetes under good control   HTN (hypertension) BP elevated probably due to anxiety and decrease dose of losartan. -Increased losartan to home dose -Reassess BP at follow-up and adjust antihypertensive meds as appropriate  Obesity (BMI 30-39.9) Body mass index is 33.07 kg/m. Encourage lifestyle change to lose weight.  Hypocalcemia Corrects to normal for hypoalbuminemia.  Complicated UTI (urinary tract infection) See sepsis  Hyponatremia Rate and BMP outpatient                 Vital signs Vitals:   10/31/21 1319 10/31/21 1700 10/31/21 2157 11/01/21 0504  BP: (!) 160/82  (!) 164/85 Comment: Updated the nurse regarding the pt's b/p. (!) 160/87  Pulse: 98  91 96  Temp: 99 F (37.2 C)  100.2 F (37.9 C) 99.3 F (37.4 C)  Resp: (!) 31 20 18 17   Height:      Weight:      SpO2: 100%  99% 97%  TempSrc: Oral  Oral Oral  BMI (Calculated):         Discharge exam  GENERAL: No apparent distress.  Nontoxic. HEENT: MMM.  Vision and hearing grossly intact.  NECK: Supple.  No apparent JVD.  RESP:  No IWOB.  Fair aeration bilaterally. CVS:  RRR. Heart sounds normal.  ABD/GI/GU: BS+. Abd soft, NTND.  MSK/EXT:  Moves extremities. No apparent deformity. No edema.  SKIN: no apparent skin lesion or wound NEURO: Awake and alert. Oriented appropriately.  No apparent focal neuro deficit. PSYCH: Calm. Normal affect.    Discharge Instructions Discharge Instructions     Call MD for:  extreme fatigue   Complete by: As directed    Call MD for:  persistant dizziness or light-headedness   Complete by: As directed    Call MD for:  persistant nausea and vomiting   Complete by: As directed    Call MD for:  severe uncontrolled pain   Complete by: As directed    Call MD for:  temperature >100.4   Complete by: As directed    Diet - low sodium heart healthy   Complete by: As directed    Diet Carb Modified   Complete by: As directed    Discharge instructions   Complete by: As directed    It has been a pleasure taking care of you!  You were hospitalized due to possible kidney infection for which you have been started on antibiotics.  Your symptoms improved to the point we think it is safe to let you go home and follow-up with your urologist and primary care doctor.  We are discharging you on cefadroxil for possible bacterial infection and Diflucan for yeast infection.  It is very important that you complete the whole course of antibiotics regardless of improvement.  We have started you on cholesterol medication since you have uncontrolled diabetes to reduce your risk of stroke and heart attack.  You may start this once you complete the course of Diflucan.   Please review your new medication list and the directions on your medications before you take them.   Take care,   Increase activity slowly   Complete by: As directed       Allergies as of 11/01/2021       Reactions   Cat Hair Extract Itching   Itchy throat   Pollen Extract Other (See Comments)   Regular allergies, sinus headache        Medication List     STOP taking these medications    CEFDINIR PO   ibuprofen 400 MG tablet Commonly known as: ADVIL   ondansetron 4 MG tablet Commonly known as: ZOFRAN       TAKE these medications    ABREVA EX Apply 1 application. topically 3 (three) times daily as needed (cold sore).    acetaminophen 500 MG tablet Commonly known as: TYLENOL Take 1,000 mg by mouth See admin instructions. 1000 mg by mouth every 4-6 hours as needed for fever   atorvastatin 20 MG tablet Commonly known as: Lipitor Take 1 tablet (20 mg total) by mouth daily. Start taking on: November 15, 2021   cefadroxil 1 g tablet Commonly known as: DURICEF Take 1 tablet (1 g total) by mouth 2 (two) times daily for 8 days.   clobetasol ointment 0.05 % Commonly known as: TEMOVATE Apply 1 application. topically daily as needed (dermatitis).   fluconazole 200 MG tablet Commonly known as: DIFLUCAN Take 1 tablet (200 mg total) by mouth daily. Start taking on: Nov 02, 2021   linagliptin 5 MG Tabs tablet Commonly known as: TRADJENTA Take 1 tablet (5 mg total) by mouth daily.   loratadine 10 MG tablet Commonly known as: CLARITIN Take 10 mg by mouth daily as needed for allergies.   losartan 100 MG tablet Commonly known as: COZAAR Take 1 tablet (100 mg total) by mouth daily.   metFORMIN 500 MG tablet Commonly known as: GLUCOPHAGE Take 1,000 mg by mouth 2 (two) times daily with a meal.   ondansetron 8 MG disintegrating tablet Commonly known as: ZOFRAN-ODT Take 1 tablet (8 mg total) by mouth every 8 (eight) hours as needed.   pantoprazole 40 MG tablet Commonly known as: PROTONIX Take 1 tablet (40 mg total) by mouth at bedtime.   Rybelsus 7 MG Tabs Generic drug: Semaglutide Take 2 tablets by mouth daily. What changed: how much to take   tamsulosin 0.4 MG Caps capsule Commonly known as: FLOMAX Take 1 capsule (0.4 mg total) by mouth daily after breakfast.   triamcinolone cream 0.1 % Commonly known as: KENALOG Apply 1 application. topically See admin instructions. 1 application 1- times daily as needed for exzema on hands   TUMS PO Take 1-2 tablets by mouth daily as needed (acid reflux).   valACYclovir 500 MG tablet Commonly known as: VALTREX Take 500-1,000 mg by mouth See admin  instructions. Take 500-1000mg  by mouth 2-3 times a day as needed for a flare up        Consultations: Urology  Procedures/Studies:   DG Abdomen 1 View  Result Date: 10/21/2021 CLINICAL DATA:  Known ureteral stent and fevers EXAM: ABDOMEN - 1 VIEW COMPARISON:  10/14/2021 FINDINGS: Scattered large and small bowel gas is noted. Right ureteral stent is noted in satisfactory position. No definitive renal or ureteral  stones are seen. No bony abnormality is noted. IMPRESSION: Right ureteral stent in place.  No other focal abnormality is noted. Electronically Signed   By: Alcide Clever M.D.   On: 10/21/2021 00:47   DG Chest Port 1 View  Result Date: 10/16/2021 CLINICAL DATA:  Fever after recent urologic procedure. Questionable sepsis. EXAM: PORTABLE CHEST 1 VIEW COMPARISON:  CT abdomen and pelvis-10/14/2021 FINDINGS: Examination is degraded due to patient body habitus and portable technique. Normal cardiac silhouette and mediastinal contours given reduced lung volumes and AP projection. Minimal bibasilar heterogeneous opacities favored to represent atelectasis. No discrete focal airspace opacities. No pleural effusion or pneumothorax. No evidence of edema. No acute osseous abnormalities. IMPRESSION: Bibasilar atelectasis without superimposed acute cardiopulmonary disease on this hypoventilated examination. Electronically Signed   By: Simonne Come M.D.   On: 10/16/2021 11:07   DG C-Arm 1-60 Min-No Report  Result Date: 10/16/2021 Fluoroscopy was utilized by the requesting physician.  No radiographic interpretation.   CT Renal Stone Study  Result Date: 10/30/2021 CLINICAL DATA:  Right flank pain concern for renal stone recent cystoscopy with ureteral stent placement. EXAM: CT ABDOMEN AND PELVIS WITHOUT CONTRAST TECHNIQUE: Multidetector CT imaging of the abdomen and pelvis was performed following the standard protocol without IV contrast. RADIATION DOSE REDUCTION: This exam was performed according to the  departmental dose-optimization program which includes automated exposure control, adjustment of the mA and/or kV according to patient size and/or use of iterative reconstruction technique. COMPARISON:  CT Oct 14, 2021. FINDINGS: Lower chest: Right lower lobe atelectasis. Hepatobiliary: Focal hypodensity along the falciform ligament is in a region commonly associated with focal fatty infiltration and similar prior imaging. Gallbladder is unremarkable. No biliary ductal dilation. Pancreas: No pancreatic ductal dilation or evidence of acute inflammation. Spleen: No splenomegaly or focal splenic lesion. Adrenals/Urinary Tract: Bilateral adrenal glands appear normal. Prominence of the right renal collecting system and ureter with urothelial thickening, no ureteral stent is visualized. Edematous right kidney with a perinephric gas and fluid collection which extends along the periphery of the kidney measuring up to 2.8 cm in thickness on image 43/2. No left-sided hydronephrosis. No renal, ureteral or bladder calculi identified. Small volume gas in the urinary bladder. Stomach/Bowel: No radiopaque enteric contrast material was administered. Stomach is minimally distended limiting evaluation. No pathologic dilation of small or large bowel. Terminal ileum and appendix appear normal. No evidence of acute bowel inflammation. Vascular/Lymphatic: Aortic atherosclerosis without abdominal aortic aneurysm. No pathologically enlarged abdominal or pelvic lymph nodes. Reproductive: Uterus and bilateral adnexa are unremarkable. Other: Right-sided perinephric stranding.  No pneumoperitoneum. Musculoskeletal: Multilevel degenerative changes spine. No acute osseous abnormality. IMPRESSION: 1. Edematous right kidney with prominence of the right renal collecting system/ureter and urothelial thickening with a perinephric gas and fluid collection measuring up to 2.8 cm in thickness. Findings are concerning for a pyelonephritis with perinephric  abscess, correlation with laboratory values and urology consult suggested. 2. Small volume gas in the urinary bladder. 3.  Aortic Atherosclerosis (ICD10-I70.0). Electronically Signed   By: Maudry Mayhew M.D.   On: 10/30/2021 10:09   CT Renal Stone Study  Result Date: 10/14/2021 CLINICAL DATA:  Flank pain with kidney stone suspected EXAM: CT ABDOMEN AND PELVIS WITHOUT CONTRAST TECHNIQUE: Multidetector CT imaging of the abdomen and pelvis was performed following the standard protocol without IV contrast. RADIATION DOSE REDUCTION: This exam was performed according to the departmental dose-optimization program which includes automated exposure control, adjustment of the mA and/or kV according to patient size and/or use  of iterative reconstruction technique. COMPARISON:  11/27/2014 FINDINGS: Lower chest:  No contributory findings. Hepatobiliary: No focal liver abnormality.Cholecystectomy. No bile duct dilatation Pancreas: Unremarkable. Spleen: Unremarkable. Adrenals/Urinary Tract: Negative adrenals. Right hydroureteronephrosis due to a 6 x 4 mm stone at the right UVJ. Right lower pole calculus measuring 7 mm. Unremarkable bladder. Stomach/Bowel:  No obstruction. No appendicitis. Vascular/Lymphatic: No acute vascular abnormality. No mass or adenopathy. Reproductive:No pathologic findings. Other: No ascites or pneumoperitoneum. Musculoskeletal: Spinal degeneration without acute abnormalities. IMPRESSION: 1. Obstructing 6 x 4 mm stone at the right UVJ. 2. 7 mm right renal calculus. Electronically Signed   By: Tiburcio Pea M.D.   On: 10/14/2021 05:27       The results of significant diagnostics from this hospitalization (including imaging, microbiology, ancillary and laboratory) are listed below for reference.     Microbiology: Recent Results (from the past 240 hour(s))  Urine Culture     Status: Abnormal   Collection Time: 10/30/21  5:54 AM   Specimen: Urine, Clean Catch  Result Value Ref Range Status    Specimen Description   Final    URINE, CLEAN CATCH Performed at [facility], [address], [place], [place] [address]    Special Requests   Final    URINE CUP Performed at [facility], [address] [place], [place] [address]    Culture 10,000 COLONIES/mL YEAST (A)  Final   Report Status 10/31/2021 FINAL  Final  Blood culture (routine x 2)     Status: None (Preliminary result)   Collection Time: 10/30/21  8:52 AM   Specimen: BLOOD  Result Value Ref Range Status   Specimen Description   Final    BLOOD LEFT ANTECUBITAL Performed at [facility], [address] [place], [place] [address]    Special Requests   Final    BOTTLES DRAWN AEROBIC AND ANAEROBIC Blood Culture adequate volume Performed at [facility], [address], [place], [place] [address]    Culture   Final    NO GROWTH < 24 HOURS Performed at [facility], [address], [place], [place] [address]    Report Status PENDING  Incomplete     Labs:  CBC: Recent Labs  Lab 10/30/21 0641 10/30/21 1803 10/31/21 0924 11/01/21 0755  WBC 13.4*  --  11.8* 10.7*  NEUTROABS 11.5*  --   --  8.4*  HGB 8.1* 7.3* 8.0* 8.7*  HCT 24.9* 23.2* 25.0* 26.7*  MCV 89.6  --  89.0 88.1  PLT 701*  --  635* 706*   BMP &GFR Recent Labs  Lab 10/30/21 0641 10/31/21 0354  NA 136 133*  K 4.0 4.1  CL 106 104  CO2 22 20*  GLUCOSE 300* 213*  BUN 13 8  CREATININE 0.86 0.71  CALCIUM 7.6* 7.9*   Estimated Creatinine Clearance: 93.3 mL/min (by C-G formula based on SCr of 0.71 mg/dL). Liver & Pancreas: Recent Labs  Lab 10/30/21 0641 10/31/21 0354  AST  --  12*  ALT  --  9  ALKPHOS  --  48  BILITOT  --  0.5  PROT  --  5.9*  ALBUMIN 2.1* 2.0*   No results for input(s): LIPASE, AMYLASE in the last 168 hours. No results for input(s): AMMONIA in the last 168 hours. Diabetic: No results for input(s): HGBA1C in the last 72 hours. Recent  Labs  Lab 10/31/21 1139 10/31/21 1730 10/31/21 2154 11/01/21 0731 11/01/21 1138  GLUCAP  174* 212* 186* 217* 216*   Cardiac Enzymes: No results for input(s): CKTOTAL, CKMB, CKMBINDEX, TROPONINI in the last 168 hours. No results for input(s): PROBNP in the last 8760 hours. Coagulation Profile: No results for input(s): INR, PROTIME in the last 168 hours. Thyroid Function Tests: No results for input(s): TSH, T4TOTAL, FREET4, T3FREE, THYROIDAB in the last 72 hours. Lipid Profile: No results for input(s): CHOL, HDL, LDLCALC, TRIG, CHOLHDL, LDLDIRECT in the last 72 hours. Anemia Panel: Recent Labs    10/30/21 0639  TIBC 200*  IRON 23*   Urine analysis:    Component Value Date/Time   COLORURINE YELLOW 10/30/2021 0554   APPEARANCEUR HAZY (A) 10/30/2021 0554   LABSPEC 1.016 10/30/2021 0554   PHURINE 5.0 10/30/2021 0554   GLUCOSEU >=500 (A) 10/30/2021 0554   HGBUR LARGE (A) 10/30/2021 0554   BILIRUBINUR NEGATIVE 10/30/2021 0554   KETONESUR 5 (A) 10/30/2021 0554   PROTEINUR 100 (A) 10/30/2021 0554   NITRITE NEGATIVE 10/30/2021 0554   LEUKOCYTESUR LARGE (A) 10/30/2021 0554   Sepsis Labs: Invalid input(s): PROCALCITONIN, LACTICIDVEN   Time coordinating discharge: 45 minutes  SIGNED:  Almon Hercules, MD  [facility] 11/01/2021, 12:33 PM

## 2021-11-01 NOTE — Progress Notes (Signed)
  Subjective: Denies abdominal pain or flank pain.  No nausea or emesis.  Tolerating diet.  Temperature max was 100.2.  Current temp 99.3.  Voiding without difficulty.  Denies dysuria.  Objective: Vital signs in last 24 hours: Temp:  [99 F (37.2 C)-100.2 F (37.9 C)] 99.3 F (37.4 C) (05/28 0504) Pulse Rate:  [91-98] 96 (05/28 0504) Resp:  [17-31] 17 (05/28 0504) BP: (160-164)/(82-87) 160/87 (05/28 0504) SpO2:  [97 %-100 %] 97 % (05/28 0504)  Intake/Output from previous day: 05/27 0701 - 05/28 0700 In: 720 [P.O.:720] Out: -  Intake/Output this shift: No intake/output data recorded.  Poorly recorded.  She is voiding without difficulty.  Physical Exam:  General: Alert and oriented CV: RRR Lungs: Clear Abdomen: Soft, ND, NT Ext: NT, No erythema  Lab Results: Recent Labs    10/30/21 1803 10/31/21 0924 11/01/21 0755  HGB 7.3* 8.0* 8.7*  HCT 23.2* 25.0* 26.7*   BMET Recent Labs    10/30/21 0641 10/31/21 0354  NA 136 133*  K 4.0 4.1  CL 106 104  CO2 22 20*  GLUCOSE 300* 213*  BUN 13 8  CREATININE 0.86 0.71  CALCIUM 7.6* 7.9*     Studies/Results: No results found.  Assessment/Plan: Right perirenal hematoma and fluid extravasation after ureteroscopy with laser lithotripsy with Dr. Marlou Porch on 10/28/2021  -Pain control. -Diet as tolerated -Reviewed CT A/P 5/26 with an edematous right kidney with some fluid around her kidney however no hydronephrosis.  She did remove her ureteral stent while she was in the emergency department on 5/26 prior to CT evaluation. -Urine culture 10/30/2021 with 10K yeast -Continue Rocephin and Diflucan.  Appropriate to send home on a course of antibiotics and Diflucan. -She will follow-up as scheduled on 12/03/2021 -Okay to discharge home from urologic standpoint. -Following peripherally.  Please call with questions.   LOS: 1 day   Matt R. Neng Albee MD 11/01/2021, 10:28 AM [facility]  Pager: [phone]

## 2021-11-04 LAB — CULTURE, BLOOD (ROUTINE X 2)
Culture: NO GROWTH
Culture: NO GROWTH
Special Requests: ADEQUATE
Special Requests: ADEQUATE

## 2021-12-03 ENCOUNTER — Ambulatory Visit (INDEPENDENT_AMBULATORY_CARE_PROVIDER_SITE_OTHER): Payer: Commercial Managed Care - PPO | Admitting: Bariatrics

## 2021-12-17 ENCOUNTER — Ambulatory Visit (INDEPENDENT_AMBULATORY_CARE_PROVIDER_SITE_OTHER): Payer: Commercial Managed Care - PPO | Admitting: Bariatrics

## 2022-01-13 ENCOUNTER — Encounter (INDEPENDENT_AMBULATORY_CARE_PROVIDER_SITE_OTHER): Payer: Self-pay

## 2022-04-20 ENCOUNTER — Telehealth: Payer: Self-pay | Admitting: Genetic Counselor

## 2022-04-20 NOTE — Telephone Encounter (Signed)
Called patient per 11/14 in basket. Left voicemail for upcoming appointments

## 2022-06-23 ENCOUNTER — Encounter: Payer: Commercial Managed Care - PPO | Admitting: Genetic Counselor

## 2022-06-28 ENCOUNTER — Inpatient Hospital Stay: Payer: Commercial Managed Care - PPO

## 2022-06-28 ENCOUNTER — Inpatient Hospital Stay: Payer: Commercial Managed Care - PPO | Attending: Genetic Counselor | Admitting: Genetic Counselor

## 2022-06-28 ENCOUNTER — Encounter: Payer: Self-pay | Admitting: Genetic Counselor

## 2022-06-28 ENCOUNTER — Other Ambulatory Visit: Payer: Self-pay

## 2022-06-28 DIAGNOSIS — Z1509 Genetic susceptibility to other malignant neoplasm: Secondary | ICD-10-CM | POA: Diagnosis not present

## 2022-06-28 DIAGNOSIS — Z8042 Family history of malignant neoplasm of prostate: Secondary | ICD-10-CM

## 2022-06-28 DIAGNOSIS — Z1501 Genetic susceptibility to malignant neoplasm of breast: Secondary | ICD-10-CM

## 2022-06-28 DIAGNOSIS — Z1379 Encounter for other screening for genetic and chromosomal anomalies: Secondary | ICD-10-CM | POA: Diagnosis not present

## 2022-06-28 DIAGNOSIS — Z8041 Family history of malignant neoplasm of ovary: Secondary | ICD-10-CM

## 2022-06-28 DIAGNOSIS — Z803 Family history of malignant neoplasm of breast: Secondary | ICD-10-CM

## 2022-06-28 HISTORY — DX: Genetic susceptibility to malignant neoplasm of breast: Z15.01

## 2022-06-28 NOTE — Progress Notes (Signed)
GENETIC TEST RESULTS   Patient Name: Katherine Gay Patient Age: 52 y.o. Encounter Date: 06/28/2022  Referring Provider: Earnstine Regal, PA-C  In September 2023, Ms. Hilgert proceeded with hereditary cancer genetic testing given her strong maternal family history of cancer.  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 Ms. Ronda was referred by her GYN office to discuss appropriate follow-up given her BRCA2 positive result.    FAMILY HISTORY:  We obtained a detailed, 4-generation family history.  Significant diagnoses are listed below: Family History  Problem Relation Age of Onset   Breast cancer Mother 85   Ovarian cancer Mother 45   Uterine cancer Mother 27   Squamous cell carcinoma Mother 36       hand   Prostate cancer Maternal Uncle        dx < 72   Cancer Paternal Uncle        x2 pat uncles; unknown type   Breast cancer Maternal Grandmother 18   Breast cancer Other 17       MGM's sister   Prostate cancer Other        MGM's brother; dx 37s   Testicular cancer Cousin        maternal cousin; dx 37s      GENETIC TESTING: Ms. Lapre tested positive for a single pathogenic variant in the BRCA2 gene. Specifically, this variant is c. [id number] (p.[id number]*12).  She also tested positive for a single pathogenic variant in the [place] gene. Specifically, this variant is c.[id number]_[id number] (p.Lys477dup). 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  No other deleterious variants were detected in [facility] Panel.  The [facility] gene panel offered by [employer] includes sequencing and large rearrangement (LR) analysis of the following 48 genes: APC, ATM, AXIN2,  BAP1, BARD1, BMPR1A, BRCA1, BRCA2, BRIP1, CHD1, CDK4, CDKN2A(p16 and p14ARF), CHEK2, CTNNA1, EGFR (exons 18-21), EPCAM (exons 8-9, LR only), FH, FLCN, GREM1 (exon 1 and upstream regulatory regions, LR only), HOXB13, MEN1, MET, MITF (c. 952, seq only), MLH1, MSH2, MSH3 (excluding repetitive portions of exon 1), MSH6, MUTYH, NTHL1, PALB2, PMS2, POLD1 (exonuclease domain, seq only), POLE (exonuclease domain, seq only), PTEN, RAD51C, RAD51D, RET (exons 5, 8, 10, 11, 13-16), SDHA, SDHB, SDHC, SDHD, SMAD4, STK11,TERT (promoter region 71 bases upstream of the translation start, c.-71_-1, seq only), TP53, TSC1, TSC2, and VHL.     The test report has been scanned into EPIC and is located under the Molecular Pathology section of the Results Review tab.  A portion of the result report is included below for reference. Genetic testing reported out on March 05, 2022.      FH GENE:   Clinical information Specific variants in the FH gene can be associated with Hereditary Leiomyomatosis and Renal Cell Cancer (HLRCC). HLRCC is a rare, adult-onset tumor-predisposition syndrome that is characterized by cutaneous leiomyomas, uterine leiomyomas, and renal tumors. However, Ms. Manwarren particular likely pathogenic variant in the [place] gene [phone]) is not expected to confer risk for autosomal dominant HLRCC. 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 This result does not impact her screening recommendations at this time.    Implications for Family Members: Pathogenic variants in the [place] gene that are associated with  HLRCC have autosomal dominant inheritance. This means that an individual with a pathogenic variant has a 50% chance of passing the condition on to their offspring. However, Ms. 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 FH deficiency is  an inborn error of metabolism that is characterized by rapidly progressive neurologic impairment, including hypotonia, seizures, and cerebral atrophy. Most survive only for a few months after birth. For there to be a risk to offspring, both the patient and their partner would have to have a single pathogenic variant in the FH gene; in such a case, the risk to offspring is 25%.   Ms. Climer first degree relatives are at 50% risk for also having inherited the pathogenic variant in FH and being carriers of FH deficiency. Therefore, single site testing may be considered for all at risk relatives, especially those of reproductive age. They may contact our office at [phone] for more information or to schedule an appointment. Family members who live outside of the area are encouraged to find a genetic counselor in their area by visiting: [email or web address].    BRCA2 GENE:   Clinical Information: Hereditary breast and ovarian cancer (HBOC) syndrome is characterized by an increased lifetime risk for, generally, adult-onset cancers including, breast, contralateral breast, female breast, ovarian, prostate, melanoma and pancreatic.  The cancers associated with BRCA2 are: Female breast cancer, up to an 84% risk In women with a history of breast cancer, the risk for contralateral breast cancer 10 years after breast cancer diagnosis is 10-30%.  Female breast cancer, up to an 8% risk Ovarian cancer, 13-29% risk Pancreatic cancer, 5-10% risk Prostate cancer, 19-61% risk Melanoma, elevated risk   Management Recommendations:  Breast Screening/Risk Reduction:  Women: Breast awareness starting at age 37 Clinical breast examination every 6-12 months starting at age 56  Breast cancer screening: Age 76-29 years, annual breast MRI with and without contrast (or mammogram, if MRI is unavailable), although the age to initiate screening may be individualized based on family history Age 69-75  years, annual mammogram and breast MRI with and without contrast Age >75 years, management should be considered on an individual basis For women with a BRCA2 pathogenic or likely pathogenic variant who are treated for breast cancer and have not had a bilateral mastectomy, screening with annual mammogram and breast MRI should continue as described above. The option of prophylactic bilateral risk-reducing mastectomy (RRM), removal of the breast tissue before cancer develops, is the best option for significantly decreasing the risk of developing breast cancer. Studies have shown mastectomies reduce the risk of breast cancer by 90-95% in women with a BRCA2 mutation.   Males: Breast self-exam training and education starting at age 51 years Annual clinical breast exam starting at age 46 years  Consider annual mammogram starting at age 14 or 10 years before the earliest known female breast cancer in the family (whichever comes first).   Gynecological Cancer Screening/Risk Reduction: It is recommended that women with a BRCA2 mutation have a risk-reducing salpingo oophorectomy (RRSO), removal of the ovaries and fallopian tubes. It is reasonable to delay RRSO until age 31-45 years unless age at diagnosis in the family warrants earlier age for consideration of RRSO.  Having a RRSO is estimated to reduce the risk of ovarian cancer by up to 96%. There is still a small risk of developing an "ovarian-like" cancer in the lining of the abdomen, called the peritoneum. Another  benefit to having the ovaries removed is the risk reduction for breast cancer. If the ovaries are removed before menopause, the risk of developing breast cancer is reduced. Ovarian cancer screening is an option for women who chose not to have a RRSO or who have not yet completed their family. Current screening methods for ovarian cancer are neither sensitive nor specific, meaning that often early stage ovarian cancer cannot be diagnosed through this  screening.  Screening can also be falsely positive with no cancer present. For this reason, RRSO is recommended over screening. If ovarian cancer screening is recommended by a physician, it could include: CA-125 blood tests Transvaginal ultrasounds Clinical pelvic exams   Skin Cancer Screening and Risk Reduction: Regular skin self-examinations Individuals should notify their physicians of any changes to moles such as increasing in size, darkening in color, or other change in appearance. Annual skin examinations by a dermatologist  Follow sun-safety recommendations such as: Using UVA and UVB 30 SPF or higher sunscreen Avoiding sunburns Limiting sun exposure, especially during the hours of 11am-4pm  Wearing protective clothing and sunglasses Avoid using tanning beds For more information about the prevention of melanoma visit [email or web address]   Prostate Cancer Screening: Annual digital rectal exam (DRE) at age 31 Annual PSA blood test at age 4  Pancreatic Cancer Screening/Risk Reduction: Avoid smoking, heavy alcohol use, and obesity. It has been suggested that pancreatic cancer screening be limited to those with a family history of pancreatic cancer (first- or second-degree relative). Ideally, screening should be performed in experienced centers utilizing a multidisciplinary approach under research conditions. Recommended screening could include annual endoscopic ultrasound (preferred) and/or MRI of the pancreas starting at age 12 or 81 years younger than the earliest age diagnosis in the family.  Additional Considerations: Individuals at risk for developing breast and ovarian cancer may benefit from the use of medication to reduce their risk for cancer. These medications are referred to as chemoprevention. For example, oral contraceptive use has been shown to reduce the risk of ovarian cancer by approximately 60% in BRCA2 mutation carriers if taken for at least 5 years. This risk  reduction remains even after discontinuation of oral contraceptives. Recent studies have suggested PARP inhibitors may be a beneficial chemotherapeutic agent for a subset of patients with BRCA2-associated breast, ovarian, prostate, and pancreatic cancers. Clinical trials are currently in process to determine if and how these agents can be useful in the treatment of BRCA2 cancer patients Patients of reproductive age should be made aware of options for prenatal diagnosis and assisted reproduction including pre-implantation genetic diagnosis. Individuals with a single pathogenic BRCA2 variant are carriers of Fanconi anemia. Fanconi anemia is characterized by developmental delay apparent from infancy, short stature, microcephaly, and coarse dysmorphic features. For there to be a risk of Fanconi anemia in offspring, both the patient and their partner would each have to carry a pathogenic variant in BRCA2. In this case, the risk of having an affected child is 25%.   This information is based on current understanding of the gene and may change in the future.   Implications for Family Members: Hereditary predisposition to cancer due to pathogenic variants in the BRCA2 gene has autosomal dominant inheritance. This means that an individual with a pathogenic variant has a 50% chance of passing the condition on to his/her offspring. Identification of a pathogenic variant allows for the recognition of at-risk relatives who can pursue testing for the familial variant.  Family members are encouraged to consider genetic testing for this  familial pathogenic variant. As there are generally no childhood cancer risks associated with a single pathogenic variant in the BRCA2 gene, individuals in the family are not recommended to have testing until they reach at least 54 years of age. They may contact our office at [phone] for more information or to schedule an appointment.  Family members who live outside of the area are  encouraged to find a genetic counselor in their area by visiting: [email or web address].   Resources: FORCE (Facing Our Risk of Cancer Empowered) is a resource for those with a hereditary predisposition to develop cancer.  FORCE provides information about risk reduction, advocacy, legislation, and clinical trials.  Additionally, FORCE provides a platform for collaboration and support; which includes: peer navigation, message boards, local support groups, a toll-free helpline, research registry and recruitment, advocate training, published medical research, webinars, brochures, mastectomy photos, and more.  For more information, visit www.facingourrisk.org  Plan: Referral to high risk breast clinic to discuss breast cancer risk management She is interested in getting more information about both high risk screening options and risk reducing surgery options Discussion about ovarian cancer risk reduction pending Per patient, she has an appointment scheduled with her GYN, Dr. Delsa Bern, in early February  We discussed that based on NCCN guidelines, pancreatic cancer screening is not indicated at this time given that she does not have a known family history of pancreatic cancer.  However, we discussed that it is not unreasonable to consider pancreatic cancer screening given her BRCA2 mutation alone. We discussed that a consult for pancreatic cancer screening (Dr. Bryan Lemma at [facility]) is available if she is interested.  She knows to reach out if she wishes to be referred.  Annual skin checks Ms. Turri recently had a skin check, and she discussed this result with her dermatologist.  She plans to follow-up at least annually.  Encouraged family testing--daughter and maternal family were informed of result    We encouraged her to remain in contact with cancer genetics on a regular basis so we can update her personal and family histories and let her know of advances in cancer  genetics that may benefit this family.   Our contact number was provided. Ms. Vesely questions were answered to her satisfaction, and she knows she is welcome to call us at anytime with additional questions or concerns.      Michaela Shankel M. Joette Catching, [place], [facility] Genetic Counselor [email or web address] (P) [phone]

## 2022-07-01 ENCOUNTER — Encounter: Payer: Commercial Managed Care - PPO | Admitting: Genetic Counselor

## 2022-10-06 ENCOUNTER — Inpatient Hospital Stay: Payer: Commercial Managed Care - PPO

## 2022-10-06 ENCOUNTER — Inpatient Hospital Stay: Payer: Commercial Managed Care - PPO | Attending: Hematology and Oncology | Admitting: Hematology and Oncology

## 2022-10-06 ENCOUNTER — Other Ambulatory Visit: Payer: Self-pay

## 2022-10-06 VITALS — BP 145/86 | HR 93 | Temp 98.0°F | Resp 18 | Ht 66.0 in | Wt 215.5 lb

## 2022-10-06 DIAGNOSIS — Z1509 Genetic susceptibility to other malignant neoplasm: Secondary | ICD-10-CM | POA: Diagnosis not present

## 2022-10-06 DIAGNOSIS — Z1502 Genetic susceptibility to malignant neoplasm of ovary: Secondary | ICD-10-CM | POA: Insufficient documentation

## 2022-10-06 DIAGNOSIS — Z803 Family history of malignant neoplasm of breast: Secondary | ICD-10-CM | POA: Insufficient documentation

## 2022-10-06 DIAGNOSIS — Z1501 Genetic susceptibility to malignant neoplasm of breast: Secondary | ICD-10-CM | POA: Diagnosis not present

## 2022-10-06 NOTE — Progress Notes (Unsigned)
[facility] CONSULT NOTE  Patient Care Team: Stamey, Verda Cumins, FNP as PCP - General (Family Medicine)  CHIEF COMPLAINTS/PURPOSE OF CONSULTATION:  BRCA 2 genetic mutation.  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  She was seen by our genetic counselor and recommendation was to consider referral to high risk breast clinic  BRCA2 gene mutation: I discussed with the patient that BRCA2 is a tumor suppressor gene which helps repair damaged DNA or destroy cells if they cannot be repaired. In patients with BRCA1 or 2 mutations, the damaged DNA could not be repaired properly increasing the risk of cancers. BRCA2 gene is located on long arm of chromosome 13. These mutations are inherited in autosomal dominant fashion and hence 50% probability that their children may have a BRCA mutation.  Cancer risk:  Average to risk of cancer by age of 82: (Antoniou et al pooled pedigree data from 13 studies of 8139 index patients with breast or ovarian cancer) Breast cancer risk: 45 percent (95% CI, 33 to 54 percent) Ovarian cancer risk: 11 percent (95% CI, 4.1 to 18 percent)  [place] study: Tumor limited to risk by age of 25: Breast cancer risk: 55 percent (95% CI, 41 to 70 percent) Ovarian cancer risk: 16.5 percent (95% CI, 7.5 to 34 percent)   Other cancer risks:  1. Pancreatic cancer (relative risk is 3.51) with incidence of 4.9% in BRCA2 carriers 2. Fallopian tube carcinoma and primary peritoneal carcinoma 3. Uterine papillary serous carcinoma: Overall risk is very low 4. Colorectal cancers: In many studies there was no increased risk But there may be some for BRCA1 carriers 5. Melanoma and other skin cancers: The risk of oatmeal melanoma is increase in BRCA2 mutation carriers but it still extremely rare. 6. Endometrial  cancer: Not very clear in terms of risks it is thought to be very low  Breast cancer risk reduction/surveillance: 1. Annual mammogram and breast MRI are recommended by NCCN starting at age of 85-30 2. Chemoprevention with tamoxifen-like agents is not entirely clear. Based on NSABP P1 study in the small cohort of patients with BRCA1 mutations, tamoxifen to reduce breast cancer risk by 62% and BRCA2 carriers but not in BRCA1 carriers. The study is limited because of small numbers. I do not recommend it primarily because most commonly patients with BRCA mutations tend to have triple negative disease. 3. After childbearing, evaluation for prophylactic bilateral mastectomy is an option. 4. Oophorectomy self reduces the risk of breast cancer by 50% 5. Breast self-examinations starting at age 42   Ovarian cancer risk reduction: 1. Risk reducing bilateral salpingo-oophorectomy between the ages of 76-40 once childbearing is complete is recommended. In one study that was 72% reduction of risk of ovarian cancer and a decrease in breast cancer by approximately 50% 2. There is no clear data for surveillance with CA125 or vaginal ultrasounds 2. Oral contraception dose may be protective against ovarian cancer but it may slightly increase risk of breast cancer.  Patient would like to proceed with mammograms alternating with MRI screening.  She is scheduled for total hysterectomy early June.  She has no family history of pancreatic cancer hence no role for pancreatic cancer screening.  She is already following up with dermatology annually.  She mention she had a mammogram recently in April, I do not have  access to this however according to the patient it was unremarkable.  She is agreeable to MRI and fall.  We have discussed unknown long-term risk of gadolinium deposition and increased sensitivity of MRIs that may lead to additional biopsies.  She was okay with this.  She is not interested in taking tamoxifen at this  time.  She however requested some printed information about tamoxifen.  HISTORY OF PRESENTING ILLNESS:  Katherine Gay 52 y.o. female is here because of BRCA 2 mutation.  Katherine Gay has significant family history of breast cancer in mom at the age of 51 who also happened to have ovarian cancer.  Maternal uncle had prostate cancer.  Maternal grandmother had breast cancer at a very early age at the age of 14.  She has proceeded with genetic testing which showed a pathogenic variant in BRCA2.  She also has specific variant in the FH gene as well.  She has already met with our genetic counselor for recommendations. She is understandably anxious about her gene mutation.  She is already scheduled for total hysterectomy in early June.  She tells me she is however not ready for considering bilateral mastectomy.  She is interested in the intensified screening. At baseline she has high blood pressure, type 2 diabetes mellitus, some depression. She denies any specific complaints for me today.  MEDICAL HISTORY:  Past Medical History:  Diagnosis Date   BRCA2 gene mutation positive 06/28/2022   Depression    HSV-2 infection    HTN (hypertension)    Nephrolithiasis    bilateral -- nonobstructive per ct 11-27-2014   Right ureteral stone    Type 2 diabetes mellitus (HCC)    Urinary frequency     SURGICAL HISTORY: Past Surgical History:  Procedure Laterality Date   CESAREAN SECTION  2004   CYSTOSCOPY W/ URETERAL STENT PLACEMENT Right 10/16/2021   Procedure: CYSTOSCOPY WITH RETROGRADE PYELOGRAM/URETERAL STENT PLACEMENT;  Surgeon: Jerilee Field, MD;  Location: [facility];  Service: Urology;  Laterality: Right;   CYSTOSCOPY WITH RETROGRADE PYELOGRAM, URETEROSCOPY AND STENT PLACEMENT Right 08/01/2015   Procedure: CYSTOSCOPY WITH RIGHT RETROGRADE PYELOGRAM, URETEROSCOPY AND STENT PLACEMENT;  Surgeon: Crist Fat, MD;  Location: [facility];  Service: Urology;  Laterality: Right;   HOLMIUM  LASER APPLICATION Right 08/01/2015   Procedure: HOLMIUM LASER APPLICATION;  Surgeon: Crist Fat, MD;  Location[facility];  Service: Urology;  Laterality: Right;   LAPAROSCOPIC CHOLECYSTECTOMY  08-13-2009   WISDOM TOOTH EXTRACTION  2011    SOCIAL HISTORY: Social History   Socioeconomic History   Marital status: Married    Spouse name: Not on file   Number of children: Not on file   Years of education: Not on file   Highest education level: Not on file  Occupational History   Not on file  Tobacco Use   Smoking status: Never   Smokeless tobacco: Never  Substance and Sexual Activity   Alcohol use: Yes    Comment: RARE   Drug use: No   Sexual activity: Not on file  Other Topics Concern   Not on file  Social History Narrative   ** Merged History Encounter **       ** Merged History Encounter **       Social Determinants of Health   Financial Resource Strain: Not on file  Food Insecurity: Not on file  Transportation Needs: Not on file  Physical Activity: Not on file  Stress: Not on file  Social  Connections: Not on file  Intimate Partner Violence: Not on file    FAMILY HISTORY: Family History  Problem Relation Age of Onset   Breast cancer Mother 11   Ovarian cancer Mother 52   Uterine cancer Mother 60   Squamous cell carcinoma Mother 27       hand   Prostate cancer Maternal Uncle        dx < 60   Cancer Paternal Uncle        x2 pat uncles; unknown type   Breast cancer Maternal Grandmother 62   Breast cancer Other 53       MGM's sister   Prostate cancer Other        MGM's brother; dx 31s   Testicular cancer Cousin        maternal cousin; dx 30s    ALLERGIES:  is allergic to cat hair extract and pollen extract.  MEDICATIONS:  Current Outpatient Medications  Medication Sig Dispense Refill   acetaminophen (TYLENOL) 500 MG tablet Take 1,000 mg by mouth See admin instructions. 1000 mg by mouth every 4-6 hours as needed for fever      atorvastatin (LIPITOR) 20 MG tablet Take 1 tablet (20 mg total) by mouth daily. 90 tablet 3   Calcium Carbonate Antacid (TUMS PO) Take 1-2 tablets by mouth daily as needed (acid reflux).     clobetasol ointment (TEMOVATE) 0.05 % Apply 1 application. topically daily as needed (dermatitis).     Docosanol (ABREVA EX) Apply 1 application. topically 3 (three) times daily as needed (cold sore).     fluconazole (DIFLUCAN) 200 MG tablet Take 1 tablet (200 mg total) by mouth daily. 13 tablet 0   linagliptin (TRADJENTA) 5 MG TABS tablet Take 1 tablet (5 mg total) by mouth daily. (Patient not taking: Reported on 10/30/2021) 30 tablet 1   loratadine (CLARITIN) 10 MG tablet Take 10 mg by mouth daily as needed for allergies.     losartan (COZAAR) 100 MG tablet Take 1 tablet (100 mg total) by mouth daily. 90 tablet 1   metFORMIN (GLUCOPHAGE) 500 MG tablet Take 1,000 mg by mouth 2 (two) times daily with a meal.      ondansetron (ZOFRAN-ODT) 8 MG disintegrating tablet Take 1 tablet (8 mg total) by mouth every 8 (eight) hours as needed. 10 tablet 0   pantoprazole (PROTONIX) 40 MG tablet Take 1 tablet (40 mg total) by mouth at bedtime. 30 tablet 0   RYBELSUS 7 MG TABS Take 2 tablets by mouth daily. (Patient taking differently: Take 7 mg by mouth daily.) 60 tablet 2   tamsulosin (FLOMAX) 0.4 MG CAPS capsule Take 1 capsule (0.4 mg total) by mouth daily after breakfast. 30 capsule 0   triamcinolone cream (KENALOG) 0.1 % Apply 1 application. topically See admin instructions. 1 application 1- times daily as needed for exzema on hands     valACYclovir (VALTREX) 500 MG tablet Take 500-1,000 mg by mouth See admin instructions. Take 500-1000mg  by mouth 2-3 times a day as needed for a flare up     No current facility-administered medications for this visit.     PHYSICAL EXAMINATION: ECOG PERFORMANCE STATUS: 0 - Asymptomatic  Vitals:   10/06/22 1424  BP: (!) 145/86  Pulse: 93  Resp: 18  Temp: 98 F (36.7 C)  SpO2:  100%   Filed Weights   10/06/22 1424  Weight: 215 lb 8 oz (97.8 kg)    GENERAL:alert, no distress and comfortable Breast: Bilateral breasts inspected and  palpated. No palpable breast masses No regional adenopathy. No LE edema.  LABORATORY DATA:  I have reviewed the data as listed Lab Results  Component Value Date   WBC 10.7 (H) 11/01/2021   HGB 8.7 (L) 11/01/2021   HCT 26.7 (L) 11/01/2021   MCV 88.1 11/01/2021   PLT 706 (H) 11/01/2021     Chemistry      Component Value Date/Time   NA 136 11/01/2021 0755   K 4.0 11/01/2021 0755   CL 104 11/01/2021 0755   CO2 24 11/01/2021 0755   BUN 6 11/01/2021 0755   CREATININE 0.90 11/01/2021 0755      Component Value Date/Time   CALCIUM 8.5 (L) 11/01/2021 0755   ALKPHOS 48 10/31/2021 0354   AST 12 (L) 10/31/2021 0354   ALT 9 10/31/2021 0354   BILITOT 0.5 10/31/2021 0354       RADIOGRAPHIC STUDIES: I have personally reviewed the radiological images as listed and agreed with the findings in the report. No results found.  All questions were answered. The patient knows to call the clinic with any problems, questions or concerns. I spent 45 minutes in the care of this patient including H and P, review of records, counseling and coordination of care.     Rachel Moulds, MD 10/06/2022 2:49 PM

## 2022-10-29 ENCOUNTER — Encounter (HOSPITAL_BASED_OUTPATIENT_CLINIC_OR_DEPARTMENT_OTHER): Payer: Self-pay | Admitting: Obstetrics and Gynecology

## 2022-11-02 ENCOUNTER — Encounter (HOSPITAL_BASED_OUTPATIENT_CLINIC_OR_DEPARTMENT_OTHER): Payer: Self-pay | Admitting: Obstetrics and Gynecology

## 2022-11-02 ENCOUNTER — Other Ambulatory Visit: Payer: Self-pay

## 2022-11-02 NOTE — Progress Notes (Signed)
Your procedure is scheduled on Thursday, 11/18/2022.  Report to [facility] AT  5:30  AM.   Call this number if you have problems the morning of surgery  [phone].   OUR ADDRESS IS [address].  WE ARE LOCATED IN [facility].  PLEASE BRING YOUR INSURANCE CARD AND PHOTO ID DAY OF SURGERY.  ONLY 2 PEOPLE ARE ALLOWED IN  WAITING  ROOM                                      REMEMBER:  DO NOT EAT FOOD, CANDY GUM OR MINTS  AFTER MIDNIGHT THE NIGHT BEFORE YOUR SURGERY . YOU MAY HAVE CLEAR LIQUIDS FROM MIDNIGHT THE NIGHT BEFORE YOUR SURGERY UNTIL  4:30 AM. NO CLEAR LIQUIDS AFTER   4:30 AM DAY OF SURGERY.  YOU MAY  BRUSH YOUR TEETH MORNING OF SURGERY AND RINSE YOUR MOUTH OUT, NO CHEWING GUM CANDY OR MINTS.     CLEAR LIQUID DIET    Allowed      Water                                                                   Coffee and tea, regular and decaf  (NO cream or milk products of any type, may sweeten)                         Carbonated beverages, regular and diet                                    Sports drinks like Gatorade _____________________________________________________________________     TAKE ONLY THESE MEDICATIONS MORNING OF SURGERY: Claritin if needed, Zofran if needed, Valtrex if needed Do not take Metformin or Rybelsus on the morning of surgery. You may take Rybelsus the morning of Wednesday, 11/17/22. Do not take Rybelsus after the morning of 11/17/22. Hold until after surgery.                                        DO NOT WEAR JEWERLY/  METAL/  PIERCINGS (INCLUDING NO PLASTIC PIERCINGS) DO NOT WEAR LOTIONS, POWDERS, PERFUMES OR NAIL POLISH ON YOUR FINGERNAILS. TOENAIL POLISH IS OK TO WEAR. DO NOT SHAVE FOR 48 HOURS PRIOR TO DAY OF SURGERY.  CONTACTS, GLASSES, OR DENTURES MAY NOT BE WORN TO SURGERY.  REMEMBER: NO SMOKING, VAPING ,  DRUGS OR ALCOHOL FOR 24 HOURS BEFORE YOUR SURGERY.                                     [facility] IS NOT RESPONSIBLE  FOR ANY BELONGINGS.                                                                    Mar[facility]  [facility] - Preparing for Surgery Before surgery, you can play an important role.  Because skin is not sterile, your skin needs to be as free of germs as possible.  You can reduce the number of germs on your skin by washing with CHG (chlorahexidine gluconate) soap before surgery.  CHG is an antiseptic cleaner which kills germs and bonds with the skin to continue killing germs even after washing. Please DO NOT use if you have an allergy to CHG or antibacterial soaps.  If your skin becomes reddened/irritated stop using the CHG and inform your nurse when you arrive at Short Stay. Do not shave (including legs and underarms) for at least 48 hours prior to the first CHG shower.  You may shave your face/neck. Please follow these instructions carefully:  1.  Shower with CHG Soap the night before surgery and the  morning of Surgery.  2.  If you choose to wash your hair, wash your hair first as usual with your  normal  shampoo.  3.  After you shampoo, rinse your hair and body thoroughly to remove the  shampoo.                                        4.  Use CHG as you would any other liquid soap.  You can apply chg directly  to the skin and wash , chg soap provided, night before and morning of your surgery.  5.  Apply the CHG Soap to your body ONLY FROM THE NECK DOWN.   Do not use on face/ open                           Wound or open sores. Avoid contact with eyes, ears mouth and genitals (private parts).                       Wash face,  Genitals (private parts) with your normal soap.             6.  Wash thoroughly, paying special attention to the area where your surgery  will be performed.  7.  Thoroughly rinse your body with warm water from the neck down.  8.  DO NOT shower/wash with your normal soap after using and rinsing off  the CHG Soap.             9.  Pat yourself  dry with a clean towel.            10.  Wear clean pajamas.            11.  Place clean sheets on your bed the night of your first shower and do not  sleep with pets. Day of Surgery : Do not apply any lotions/ powders the morning of surgery.  Please wear clean clothes to the hospital/surgery center.  IF YOU HAVE ANY SKIN IRRITATION OR PROBLEMS WITH THE SURGICAL SOAP, PLEASE GET A BAR OF GOLD DIAL SOAP AND SHOWER THE NIGHT BEFORE YOUR SURGERY AND THE MORNING OF YOUR SURGERY. PLEASE LET THE NURSE KNOW MORNING OF YOUR SURGERY IF YOU HAD ANY PROBLEMS WITH THE SURGICAL SOAP.   YOUR SURGEON MAY HAVE REQUESTED EXTENDED RECOVERY TIME AFTER YOUR SURGERY. IT COULD BE A  JUST A FEW HOURS  UP TO AN OVERNIGHT STAY.  YOUR SURGEON SHOULD HAVE DISCUSSED  THIS WITH YOU PRIOR TO YOUR SURGERY. IN THE EVENT YOU NEED TO STAY OVERNIGHT PLEASE REFER TO THE FOLLOWING GUIDELINES. YOU MAY HAVE UP TO 4 VISITORS  MAY VISIT IN THE EXTENDED RECOVERY ROOM UNTIL 800 PM ONLY.  ONE  VISITOR AGE 64 AND OVER MAY SPEND THE NIGHT AND MUST BE IN EXTENDED RECOVERY ROOM NO LATER THAN 800 PM . YOUR DISCHARGE TIME AFTER YOU SPEND THE NIGHT IS 900 AM THE MORNING AFTER YOUR SURGERY. YOU MAY PACK A SMALL OVERNIGHT BAG WITH TOILETRIES FOR YOUR OVERNIGHT STAY IF YOU WISH.  REGARDLESS OF IF YOU STAY OVER NIGHT OR ARE DISCHARGED THE SAME DAY YOU WILL BE REQUIRED TO HAVE A RESPONSIBLE ADULT (18 YRS OLD OR OLDER) STAY WITH YOU FOR AT LEAST THE FIRST 24 HOURS  YOUR PRESCRIPTION MEDICATIONS WILL BE PROVIDED DURING YOUR HOSPITAL STAY.  ________________________________________________________________________                                                        QUESTIONS Mechele Claude PRE OP NURSE PHONE [phone].

## 2022-11-02 NOTE — Progress Notes (Signed)
Spoke w/ via phone for pre-op interview---Yen Lab needs dos---- none              Lab results------11/16/22 lab appt for cbc, bmp,type & screen, ekg COVID test -----patient states asymptomatic no test needed Arrive at -------0530 on 11/18/22 NPO after MN NO Solid Food.  Clear liquids from MN until---0430 Med rec completed Medications to take morning of surgery -----Claritin prn, Zofran prn, Valtrex prn Diabetic medication -----Do not take Metformin on the morning of surgery. Hold Rybelsus 24 hours before surgery, may take the morning of 11/17/22 and then hold until after surgery. Patient instructed no nail polish to be worn day of surgery Patient instructed to bring photo id and insurance card day of surgery Patient aware to have Driver (ride ) / caregiver    for 24 hours after surgery - daughter, Ila Patient Special Instructions -----Extended / overnight stay instructions given.  Pre-Op special Instructions -----Dr. Estanislado Pandy ordered pre-surgery drinks (Gatorade for diabetics.) The patient stated that she had problems in the past trying to drink Gatorade. She will talk to Dr. Estanislado Pandy about drinks at her pre-op appointment. If she decides to drink the pre-surgery drinks, she will call me so that I can provide those for her. Patient verbalized understanding of instructions that were given at this phone interview. Patient denies shortness of breath, chest pain, fever, cough at this phone interview.

## 2022-11-03 ENCOUNTER — Encounter (HOSPITAL_COMMUNITY): Payer: Self-pay

## 2022-11-09 NOTE — H&P (Signed)
Katherine Gay is a 52 y.o.   female, P: 1-0-2-1 presents for hysterectomy with removal of both tubes and ovaries because of being positive for the BRCA 2 Mutation. The patient has been menopausal since 2019 with no post menopausal bleeding, pelvic pain, problems with urination or bowel function. She has a strong family history of cancer (uterine, ovarian, breast, lung, skin, rectum and prostate). Given her diagnosis of positive  BRCA 2 Mutation, the patient  has decided to proceed with prophylactic therapy in the form of a total hysterectomy with bilateral salpingo-oophorectomy.  Past Medical History  OB History: G: 3; P: 1-0-2-1;  C-section: 2004 (6lbs. 4 oz)  GYN History: menarche: 52 YO;    LMP: menopausal;   Denies history of abnormal PAP smear;   Last PAP smear: 2020  Medical History: BRCA 2 Mutation, Anxiety, Depression, HSV-1, Diabetes Mellitus, Hypertension, Shingles, Renal Stones and Eczema.  Surgical History: 2004: C-section; 2011: Renal Stone Removal; 2017 Denies problems with anesthesia or history of blood transfusions  Family History: Cancer (Breast, Ovarian, Uterine, Lung, Skin, Prostate and Rectal), Parathyroid Disease and Migraine.  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  ROS: Admits to occasional rashes (eczema) but denies headache, vision changes, nasal congestion, dysphagia, tinnitus, dizziness, hoarseness, cough,  chest pain, shortness of breath, nausea, vomiting, diarrhea,constipation,  urinary frequency, urgency  dysuria,  hematuria, vaginitis symptoms, pelvic pain, swelling of joints,easy bruising,  myalgias, arthralgias, skin rashes, unexplained weight loss and except as is mentioned in the history of present illness, patient's review of systems is otherwise negative.     Physical Exam  Bp: 144/88;  Weight: 206.4 lbs;  Height: 5'6.5";  BMI: 32.8  Neck: supple without masses or thyromegaly Lungs: clear to auscultation Heart: regular rate and rhythm Abdomen: soft, non-tender and no organomegaly Pelvic:EGBUS- wnl; vagina-normal rugae; uterus-normal size, cervix without lesions or motion tenderness; adnexae-no tenderness or masses Extremities:  no clubbing, cyanosis or edema   Assesment:  BRCA 2 Mutation Positive   Disposition:  The robot-assisted hysterectomy was reviewed with the patient along with the indication for her procedure. Benefits of the robotic approach include lesser postoperative pain, less blood loss during surgery, reduced risk of injury to other organs, due to better visualization with a 3-D HD 10 times magnifying camera; shorter hospital stay between 0-1 night and rapid recovery with return to daily routine in 2-3 weeks (however, nothing in the vagina for 6 weeks). Although the robot-assisted hysterectomy has a longer operative time than traditional laparotomy, the benefits usually outweigh the risks, in a patient with good medical history,   Risks include bleeding, infection, injury to other organs (bladder more vulnerable if previous cesarean delivery) , need for laparotomy, transient post-operative facial edema, increased risk of pelvic prolapse (associated with any hysterectomy) as well as earlier onset of menopause.   The patient's questions were answered and she verbalized understanding of the risks and pre-operative instructions. The patient has consented to proceed with a Robot Assisted Laparoscopic Hysterectomy with Bilateral Salpingo-oophorectomy at [facility] on  November 18, 2022. CSN# [id number]   Brance Dartt J. Lowell Guitar, PA-C  for Dr. Crist Fat. Rivard

## 2022-11-16 ENCOUNTER — Encounter (HOSPITAL_COMMUNITY)
Admission: RE | Admit: 2022-11-16 | Discharge: 2022-11-16 | Disposition: A | Discharge status: Home / Self Care | Payer: Commercial Managed Care - PPO | Source: Ambulatory Visit | Attending: Obstetrics and Gynecology | Admitting: Obstetrics and Gynecology

## 2022-11-16 DIAGNOSIS — Z1501 Genetic susceptibility to malignant neoplasm of breast: Secondary | ICD-10-CM | POA: Diagnosis not present

## 2022-11-16 DIAGNOSIS — Z01818 Encounter for other preprocedural examination: Secondary | ICD-10-CM | POA: Insufficient documentation

## 2022-11-16 DIAGNOSIS — I10 Essential (primary) hypertension: Secondary | ICD-10-CM | POA: Insufficient documentation

## 2022-11-16 DIAGNOSIS — Z1509 Genetic susceptibility to other malignant neoplasm: Secondary | ICD-10-CM | POA: Insufficient documentation

## 2022-11-16 LAB — CBC
HCT: 37.6 % (ref 36.0–46.0)
Hemoglobin: 12.3 g/dL (ref 12.0–15.0)
MCH: 28.7 pg (ref 26.0–34.0)
MCHC: 32.7 g/dL (ref 30.0–36.0)
MCV: 87.9 fL (ref 80.0–100.0)
Platelets: 337 10*3/uL (ref 150–400)
RBC: 4.28 MIL/uL (ref 3.87–5.11)
RDW: 13.1 % (ref 11.5–15.5)
WBC: 6.8 10*3/uL (ref 4.0–10.5)
nRBC: 0 % (ref 0.0–0.2)

## 2022-11-16 LAB — BASIC METABOLIC PANEL
Anion gap: 9 (ref 5–15)
BUN: 16 mg/dL (ref 6–20)
CO2: 24 mmol/L (ref 22–32)
Calcium: 9 mg/dL (ref 8.9–10.3)
Chloride: 103 mmol/L (ref 98–111)
Creatinine, Ser: 1.01 mg/dL — ABNORMAL HIGH (ref 0.44–1.00)
GFR, Estimated: >60 mL/min (ref >60)
Glucose, Bld: 203 mg/dL — ABNORMAL HIGH (ref 70–99)
Potassium: 4 mmol/L (ref 3.5–5.1)
Sodium: 136 mmol/L (ref 135–145)

## 2022-11-16 LAB — TYPE AND SCREEN: Antibody Screen: NEGATIVE

## 2022-11-17 NOTE — Anesthesia Preprocedure Evaluation (Addendum)
Anesthesia Evaluation  Patient identified by MRN, date of birth, ID band Patient awake    Reviewed: Allergy & Precautions, H&P , NPO status , Patient's Chart, lab work & pertinent test results  Airway Mallampati: II  TM Distance: >3 FB Neck ROM: Full    Dental no notable dental hx. (+) Teeth Intact, Dental Advisory Given   Pulmonary neg pulmonary ROS   Pulmonary exam normal breath sounds clear to auscultation       Cardiovascular Exercise Tolerance: Good hypertension, Pt. on medications negative cardio ROS Normal cardiovascular exam Rhythm:Regular Rate:Normal     Neuro/Psych  PSYCHIATRIC DISORDERS  Depression    negative neurological ROS  negative psych ROS   GI/Hepatic negative GI ROS, Neg liver ROS,,,  Endo/Other  negative endocrine ROSdiabetes, Type 2    Renal/GU negative Renal ROS  negative genitourinary   Musculoskeletal negative musculoskeletal ROS (+)    Abdominal   Peds negative pediatric ROS (+)  Hematology negative hematology ROS (+) Blood dyscrasia, anemia   Anesthesia Other Findings   Reproductive/Obstetrics negative OB ROS                             Anesthesia Physical Anesthesia Plan  ASA: 3  Anesthesia Plan: General   Post-op Pain Management: Tylenol PO (pre-op)*, Celebrex PO (pre-op)* and Precedex   Induction: Intravenous  PONV Risk Score and Plan: 3 and Ondansetron and Dexamethasone  Airway Management Planned: Oral ETT  Additional Equipment: None  Intra-op Plan:   Post-operative Plan: Extubation in OR  Informed Consent: I have reviewed the patients History and Physical, chart, labs and discussed the procedure including the risks, benefits and alternatives for the proposed anesthesia with the patient or authorized representative who has indicated his/her understanding and acceptance.       Plan Discussed with: Anesthesiologist and CRNA  Anesthesia  Plan Comments: (  )       Anesthesia Quick Evaluation

## 2022-11-18 ENCOUNTER — Ambulatory Visit (HOSPITAL_BASED_OUTPATIENT_CLINIC_OR_DEPARTMENT_OTHER): Payer: Commercial Managed Care - PPO | Admitting: Anesthesiology

## 2022-11-18 ENCOUNTER — Other Ambulatory Visit: Payer: Self-pay

## 2022-11-18 ENCOUNTER — Ambulatory Visit (HOSPITAL_BASED_OUTPATIENT_CLINIC_OR_DEPARTMENT_OTHER)
Admission: RE | Admit: 2022-11-18 | Discharge: 2022-11-18 | Disposition: A | Discharge status: Home / Self Care | Payer: Commercial Managed Care - PPO | Attending: Obstetrics and Gynecology | Admitting: Obstetrics and Gynecology

## 2022-11-18 ENCOUNTER — Encounter (HOSPITAL_BASED_OUTPATIENT_CLINIC_OR_DEPARTMENT_OTHER): Payer: Self-pay | Admitting: Obstetrics and Gynecology

## 2022-11-18 ENCOUNTER — Encounter (HOSPITAL_BASED_OUTPATIENT_CLINIC_OR_DEPARTMENT_OTHER)
Admission: RE | Disposition: A | Discharge status: Home / Self Care | Payer: Self-pay | Source: Home / Self Care | Attending: Obstetrics and Gynecology

## 2022-11-18 DIAGNOSIS — N83202 Unspecified ovarian cyst, left side: Secondary | ICD-10-CM | POA: Insufficient documentation

## 2022-11-18 DIAGNOSIS — Z4009 Encounter for prophylactic removal of other organ: Secondary | ICD-10-CM | POA: Diagnosis not present

## 2022-11-18 DIAGNOSIS — Z148 Genetic carrier of other disease: Secondary | ICD-10-CM | POA: Insufficient documentation

## 2022-11-18 DIAGNOSIS — E119 Type 2 diabetes mellitus without complications: Secondary | ICD-10-CM

## 2022-11-18 DIAGNOSIS — D649 Anemia, unspecified: Secondary | ICD-10-CM | POA: Insufficient documentation

## 2022-11-18 DIAGNOSIS — N840 Polyp of corpus uteri: Secondary | ICD-10-CM | POA: Insufficient documentation

## 2022-11-18 DIAGNOSIS — D259 Leiomyoma of uterus, unspecified: Secondary | ICD-10-CM | POA: Diagnosis not present

## 2022-11-18 DIAGNOSIS — Z4002 Encounter for prophylactic removal of ovary: Secondary | ICD-10-CM

## 2022-11-18 DIAGNOSIS — Z9189 Other specified personal risk factors, not elsewhere classified: Secondary | ICD-10-CM | POA: Diagnosis present

## 2022-11-18 DIAGNOSIS — Z808 Family history of malignant neoplasm of other organs or systems: Secondary | ICD-10-CM | POA: Insufficient documentation

## 2022-11-18 DIAGNOSIS — N83201 Unspecified ovarian cyst, right side: Secondary | ICD-10-CM | POA: Insufficient documentation

## 2022-11-18 DIAGNOSIS — Z803 Family history of malignant neoplasm of breast: Secondary | ICD-10-CM | POA: Diagnosis not present

## 2022-11-18 DIAGNOSIS — Z1509 Genetic susceptibility to other malignant neoplasm: Secondary | ICD-10-CM | POA: Diagnosis not present

## 2022-11-18 DIAGNOSIS — F32A Depression, unspecified: Secondary | ICD-10-CM | POA: Diagnosis not present

## 2022-11-18 DIAGNOSIS — Z1501 Genetic susceptibility to malignant neoplasm of breast: Secondary | ICD-10-CM

## 2022-11-18 DIAGNOSIS — N736 Female pelvic peritoneal adhesions (postinfective): Secondary | ICD-10-CM

## 2022-11-18 DIAGNOSIS — I10 Essential (primary) hypertension: Secondary | ICD-10-CM

## 2022-11-18 DIAGNOSIS — Z8042 Family history of malignant neoplasm of prostate: Secondary | ICD-10-CM | POA: Diagnosis not present

## 2022-11-18 DIAGNOSIS — Z800 Family history of malignant neoplasm of digestive organs: Secondary | ICD-10-CM | POA: Diagnosis not present

## 2022-11-18 DIAGNOSIS — Z1502 Genetic susceptibility to malignant neoplasm of ovary: Secondary | ICD-10-CM | POA: Diagnosis not present

## 2022-11-18 DIAGNOSIS — N838 Other noninflammatory disorders of ovary, fallopian tube and broad ligament: Secondary | ICD-10-CM | POA: Insufficient documentation

## 2022-11-18 DIAGNOSIS — Z1589 Genetic susceptibility to other disease: Secondary | ICD-10-CM

## 2022-11-18 DIAGNOSIS — Z8041 Family history of malignant neoplasm of ovary: Secondary | ICD-10-CM | POA: Insufficient documentation

## 2022-11-18 DIAGNOSIS — Z01818 Encounter for other preprocedural examination: Secondary | ICD-10-CM

## 2022-11-18 HISTORY — DX: Anemia, unspecified: D64.9

## 2022-11-18 HISTORY — PX: ROBOTIC ASSISTED TOTAL HYSTERECTOMY WITH BILATERAL SALPINGO OOPHERECTOMY: SHX6086

## 2022-11-18 HISTORY — DX: Personal history of urinary calculi: Z87.442

## 2022-11-18 HISTORY — DX: Sepsis, unspecified organism: A41.9

## 2022-11-18 LAB — GLUCOSE, CAPILLARY
Glucose-Capillary: 176 mg/dL — ABNORMAL HIGH (ref 70–99)
Glucose-Capillary: 242 mg/dL — ABNORMAL HIGH (ref 70–99)

## 2022-11-18 LAB — ABO/RH: ABO/RH(D): O POS

## 2022-11-18 LAB — TYPE AND SCREEN: ABO/RH(D): O POS

## 2022-11-18 SURGERY — HYSTERECTOMY, TOTAL, ROBOT-ASSISTED, LAPAROSCOPIC, WITH BILATERAL SALPINGO-OOPHORECTOMY
Anesthesia: General | Laterality: Bilateral

## 2022-11-18 MED ORDER — HYDRALAZINE HCL 20 MG/ML IJ SOLN
INTRAMUSCULAR | Status: DC | PRN
  Administered 2022-11-18: 4 mg via INTRAVENOUS

## 2022-11-18 MED ORDER — DEXMEDETOMIDINE HCL IN NACL 80 MCG/20ML IV SOLN
INTRAVENOUS | Status: AC
  Filled 2022-11-18: qty 20

## 2022-11-18 MED ORDER — SIMETHICONE 80 MG PO CHEW
CHEWABLE_TABLET | ORAL | Status: AC
  Filled 2022-11-18: qty 1

## 2022-11-18 MED ORDER — STERILE WATER FOR IRRIGATION IR SOLN
Status: DC | PRN
  Administered 2022-11-18: 500 mL

## 2022-11-18 MED ORDER — ACETAMINOPHEN 500 MG PO TABS
ORAL_TABLET | ORAL | Status: AC
  Filled 2022-11-18: qty 2

## 2022-11-18 MED ORDER — SUGAMMADEX SODIUM 200 MG/2ML IV SOLN
INTRAVENOUS | Status: DC | PRN
  Administered 2022-11-18: 100 mg via INTRAVENOUS
  Administered 2022-11-18: 200 mg via INTRAVENOUS

## 2022-11-18 MED ORDER — CELECOXIB 200 MG PO CAPS
400.0000 mg | ORAL_CAPSULE | ORAL | Status: AC
  Administered 2022-11-18: 400 mg via ORAL

## 2022-11-18 MED ORDER — CELECOXIB 200 MG PO CAPS
ORAL_CAPSULE | ORAL | Status: AC
  Filled 2022-11-18: qty 2

## 2022-11-18 MED ORDER — ONDANSETRON HCL 4 MG PO TABS: 4.0000 mg | ORAL_TABLET | Freq: Four times a day (QID) | ORAL | Status: DC | PRN

## 2022-11-18 MED ORDER — MEPERIDINE HCL 25 MG/ML IJ SOLN: 6.2500 mg | INTRAMUSCULAR | Status: DC | PRN

## 2022-11-18 MED ORDER — FENTANYL CITRATE (PF) 100 MCG/2ML IJ SOLN
INTRAMUSCULAR | Status: AC
  Filled 2022-11-18: qty 2

## 2022-11-18 MED ORDER — IBUPROFEN 200 MG PO TABS
600.0000 mg | ORAL_TABLET | Freq: Four times a day (QID) | ORAL | Status: DC
  Administered 2022-11-18: 600 mg via ORAL

## 2022-11-18 MED ORDER — ENSURE PRE-SURGERY PO LIQD: 592.0000 mL | Freq: Once | ORAL | Status: DC

## 2022-11-18 MED ORDER — IBUPROFEN 200 MG PO TABS
ORAL_TABLET | ORAL | Status: AC
  Filled 2022-11-18: qty 3

## 2022-11-18 MED ORDER — ACETAMINOPHEN 500 MG PO TABS: ORAL_TABLET | ORAL | 1 refills | Status: AC

## 2022-11-18 MED ORDER — HYDRALAZINE HCL 20 MG/ML IJ SOLN
INTRAMUSCULAR | Status: AC
  Filled 2022-11-18: qty 1

## 2022-11-18 MED ORDER — OXYCODONE HCL 5 MG PO TABS
ORAL_TABLET | ORAL | Status: AC
  Filled 2022-11-18: qty 1

## 2022-11-18 MED ORDER — PROPOFOL 10 MG/ML IV BOLUS
INTRAVENOUS | Status: AC
  Filled 2022-11-18: qty 20

## 2022-11-18 MED ORDER — FENTANYL CITRATE (PF) 100 MCG/2ML IJ SOLN
INTRAMUSCULAR | Status: DC | PRN
  Administered 2022-11-18 (×5): 50 ug via INTRAVENOUS

## 2022-11-18 MED ORDER — DIPHENHYDRAMINE HCL 50 MG/ML IJ SOLN
INTRAMUSCULAR | Status: DC | PRN
  Administered 2022-11-18: 12.5 mg via INTRAVENOUS

## 2022-11-18 MED ORDER — SIMETHICONE 80 MG PO CHEW
80.0000 mg | CHEWABLE_TABLET | Freq: Four times a day (QID) | ORAL | Status: DC | PRN
  Administered 2022-11-18: 80 mg via ORAL

## 2022-11-18 MED ORDER — LACTATED RINGERS IV SOLN
INTRAVENOUS | Status: DC
  Administered 2022-11-18: 1000 mL via INTRAVENOUS

## 2022-11-18 MED ORDER — DEXAMETHASONE SODIUM PHOSPHATE 10 MG/ML IJ SOLN
INTRAMUSCULAR | Status: AC
  Filled 2022-11-18: qty 1

## 2022-11-18 MED ORDER — DOCUSATE SODIUM 100 MG PO CAPS
ORAL_CAPSULE | ORAL | Status: AC
  Filled 2022-11-18: qty 1

## 2022-11-18 MED ORDER — LACTATED RINGERS IV SOLN: INTRAVENOUS | Status: DC

## 2022-11-18 MED ORDER — SODIUM CHLORIDE 0.9 % IV SOLN
INTRAVENOUS | Status: DC | PRN
  Administered 2022-11-18: 103 mL

## 2022-11-18 MED ORDER — ACETAMINOPHEN 325 MG PO TABS: 325.0000 mg | ORAL_TABLET | ORAL | Status: DC | PRN

## 2022-11-18 MED ORDER — LIDOCAINE 2% (20 MG/ML) 5 ML SYRINGE
INTRAMUSCULAR | Status: DC | PRN
  Administered 2022-11-18: 100 mg via INTRAVENOUS

## 2022-11-18 MED ORDER — DIPHENHYDRAMINE HCL 50 MG/ML IJ SOLN
INTRAMUSCULAR | Status: AC
  Filled 2022-11-18: qty 1

## 2022-11-18 MED ORDER — SCOPOLAMINE 1 MG/3DAYS TD PT72
MEDICATED_PATCH | TRANSDERMAL | Status: AC
  Filled 2022-11-18: qty 1

## 2022-11-18 MED ORDER — SODIUM CHLORIDE (PF) 0.9 % IJ SOLN
INTRAMUSCULAR | Status: AC
  Filled 2022-11-18: qty 10

## 2022-11-18 MED ORDER — OXYCODONE HCL 5 MG PO TABS: 5.0000 mg | ORAL_TABLET | Freq: Once | ORAL | Status: DC | PRN

## 2022-11-18 MED ORDER — MENTHOL 3 MG MT LOZG: 1.0000 | LOZENGE | OROMUCOSAL | Status: DC | PRN

## 2022-11-18 MED ORDER — SODIUM CHLORIDE 0.9 % IR SOLN
Status: DC | PRN
  Administered 2022-11-18: 1000 mL

## 2022-11-18 MED ORDER — HYDROMORPHONE HCL 1 MG/ML IJ SOLN
INTRAMUSCULAR | Status: DC | PRN
  Administered 2022-11-18 (×4): .5 mg via INTRAVENOUS

## 2022-11-18 MED ORDER — ENSURE PRE-SURGERY PO LIQD
296.0000 mL | Freq: Once | ORAL | Status: DC
Start: 2022-11-19 — End: 2022-11-19

## 2022-11-18 MED ORDER — ACETAMINOPHEN 500 MG PO TABS
1000.0000 mg | ORAL_TABLET | Freq: Four times a day (QID) | ORAL | Status: DC
  Administered 2022-11-18 (×2): 1000 mg via ORAL

## 2022-11-18 MED ORDER — ONDANSETRON HCL 4 MG/2ML IJ SOLN
INTRAMUSCULAR | Status: AC
  Filled 2022-11-18: qty 2

## 2022-11-18 MED ORDER — CEFAZOLIN SODIUM-DEXTROSE 2-4 GM/100ML-% IV SOLN
2.0000 g | INTRAVENOUS | Status: AC
  Administered 2022-11-18: 2 g via INTRAVENOUS

## 2022-11-18 MED ORDER — OXYCODONE HCL 5 MG PO TABS
5.0000 mg | ORAL_TABLET | ORAL | Status: DC | PRN
  Administered 2022-11-18 (×2): 5 mg via ORAL

## 2022-11-18 MED ORDER — POVIDONE-IODINE 10 % EX SWAB: 2.0000 | Freq: Once | CUTANEOUS | Status: DC

## 2022-11-18 MED ORDER — CEFAZOLIN SODIUM-DEXTROSE 2-4 GM/100ML-% IV SOLN
INTRAVENOUS | Status: AC
  Filled 2022-11-18: qty 100

## 2022-11-18 MED ORDER — IBUPROFEN 600 MG PO TABS: ORAL_TABLET | ORAL | 1 refills | Status: AC

## 2022-11-18 MED ORDER — ROCURONIUM BROMIDE 10 MG/ML (PF) SYRINGE
PREFILLED_SYRINGE | INTRAVENOUS | Status: AC
  Filled 2022-11-18: qty 10

## 2022-11-18 MED ORDER — ARTIFICIAL TEARS OPHTHALMIC OINT
TOPICAL_OINTMENT | OPHTHALMIC | Status: AC
  Filled 2022-11-18: qty 3.5

## 2022-11-18 MED ORDER — OXYCODONE HCL 5 MG/5ML PO SOLN: 5.0000 mg | Freq: Once | ORAL | Status: DC | PRN

## 2022-11-18 MED ORDER — ROCURONIUM BROMIDE 10 MG/ML (PF) SYRINGE
PREFILLED_SYRINGE | INTRAVENOUS | Status: DC | PRN
  Administered 2022-11-18: 100 mg via INTRAVENOUS
  Administered 2022-11-18: 20 mg via INTRAVENOUS

## 2022-11-18 MED ORDER — GABAPENTIN 300 MG PO CAPS
300.0000 mg | ORAL_CAPSULE | ORAL | Status: AC
  Administered 2022-11-18: 300 mg via ORAL

## 2022-11-18 MED ORDER — LIDOCAINE HCL URETHRAL/MUCOSAL 2 % EX GEL
CUTANEOUS | Status: DC | PRN
  Administered 2022-11-18: 1

## 2022-11-18 MED ORDER — MIDAZOLAM HCL 5 MG/5ML IJ SOLN
INTRAMUSCULAR | Status: DC | PRN
  Administered 2022-11-18: 2 mg via INTRAVENOUS

## 2022-11-18 MED ORDER — ACETAMINOPHEN 500 MG PO TABS
1000.0000 mg | ORAL_TABLET | ORAL | Status: AC
  Administered 2022-11-18: 1000 mg via ORAL

## 2022-11-18 MED ORDER — MIDAZOLAM HCL 2 MG/2ML IJ SOLN
INTRAMUSCULAR | Status: AC
  Filled 2022-11-18: qty 2

## 2022-11-18 MED ORDER — ONDANSETRON HCL 4 MG/2ML IJ SOLN
INTRAMUSCULAR | Status: DC | PRN
  Administered 2022-11-18: 4 mg via INTRAVENOUS

## 2022-11-18 MED ORDER — DEXAMETHASONE SODIUM PHOSPHATE 10 MG/ML IJ SOLN
INTRAMUSCULAR | Status: DC | PRN
  Administered 2022-11-18: 5 mg via INTRAVENOUS

## 2022-11-18 MED ORDER — ONDANSETRON HCL 4 MG/2ML IJ SOLN
4.0000 mg | Freq: Four times a day (QID) | INTRAMUSCULAR | Status: DC | PRN
  Administered 2022-11-18 (×2): 4 mg via INTRAVENOUS

## 2022-11-18 MED ORDER — PROPOFOL 10 MG/ML IV BOLUS
INTRAVENOUS | Status: DC | PRN
  Administered 2022-11-18: 150 mg via INTRAVENOUS

## 2022-11-18 MED ORDER — OXYCODONE HCL 5 MG PO TABS: ORAL_TABLET | ORAL | 0 refills | Status: AC

## 2022-11-18 MED ORDER — FENTANYL CITRATE (PF) 100 MCG/2ML IJ SOLN: 25.0000 ug | INTRAMUSCULAR | Status: DC | PRN

## 2022-11-18 MED ORDER — ACETAMINOPHEN 160 MG/5ML PO SOLN: 325.0000 mg | ORAL | Status: DC | PRN

## 2022-11-18 MED ORDER — LIDOCAINE HCL (PF) 2 % IJ SOLN
INTRAMUSCULAR | Status: AC
  Filled 2022-11-18: qty 5

## 2022-11-18 MED ORDER — ONDANSETRON HCL 4 MG/2ML IJ SOLN: 4.0000 mg | Freq: Once | INTRAMUSCULAR | Status: DC | PRN

## 2022-11-18 MED ORDER — SCOPOLAMINE 1 MG/3DAYS TD PT72
1.0000 | MEDICATED_PATCH | TRANSDERMAL | Status: DC
  Administered 2022-11-18: 1.5 mg via TRANSDERMAL

## 2022-11-18 MED ORDER — DOCUSATE SODIUM 100 MG PO CAPS
100.0000 mg | ORAL_CAPSULE | Freq: Two times a day (BID) | ORAL | Status: DC
  Administered 2022-11-18: 100 mg via ORAL

## 2022-11-18 MED ORDER — DEXMEDETOMIDINE HCL IN NACL 80 MCG/20ML IV SOLN
INTRAVENOUS | Status: DC | PRN
  Administered 2022-11-18: 12 ug via INTRAVENOUS

## 2022-11-18 MED ORDER — HYDROMORPHONE HCL 2 MG/ML IJ SOLN
INTRAMUSCULAR | Status: AC
  Filled 2022-11-18: qty 1

## 2022-11-18 MED ORDER — GABAPENTIN 300 MG PO CAPS
ORAL_CAPSULE | ORAL | Status: AC
  Filled 2022-11-18: qty 1

## 2022-11-18 SURGICAL SUPPLY — 74 items
ADH SKN CLS APL DERMABOND .7 (GAUZE/BANDAGES/DRESSINGS) ×1
BAG DRN RND TRDRP ANRFLXCHMBR (UROLOGICAL SUPPLIES) ×1
BAG URINE DRAIN 2000ML AR STRL (UROLOGICAL SUPPLIES) ×1 IMPLANT
BARRIER ADHS 3X4 INTERCEED (GAUZE/BANDAGES/DRESSINGS) ×1 IMPLANT
BRR ADH 4X3 ABS CNTRL BYND (GAUZE/BANDAGES/DRESSINGS) ×1
CATH FOLEY 3WAY 5CC 16FR (CATHETERS) ×1 IMPLANT
COVER BACK TABLE 60X90IN (DRAPES) ×1 IMPLANT
COVER TIP SHEARS 8 DVNC (MISCELLANEOUS) ×1 IMPLANT
DEFOGGER SCOPE WARMER CLEARIFY (MISCELLANEOUS) ×1 IMPLANT
DERMABOND ADVANCED .7 DNX12 (GAUZE/BANDAGES/DRESSINGS) ×1 IMPLANT
DILATOR CANAL MILEX (MISCELLANEOUS) IMPLANT
DRAPE ARM DVNC X/XI (DISPOSABLE) ×4 IMPLANT
DRAPE COLUMN DVNC XI (DISPOSABLE) ×1 IMPLANT
DRAPE SURG IRRIG POUCH 19X23 (DRAPES) ×1 IMPLANT
DRAPE UTILITY XL STRL (DRAPES) ×1 IMPLANT
DRIVER NDL MEGA SUTCUT DVNCXI (INSTRUMENTS) ×1 IMPLANT
DRIVER NDLE MEGA SUTCUT DVNCXI (INSTRUMENTS) ×1 IMPLANT
DURAPREP 26ML APPLICATOR (WOUND CARE) ×1 IMPLANT
ELECT REM PT RETURN 9FT ADLT (ELECTROSURGICAL) ×1
ELECTRODE REM PT RTRN 9FT ADLT (ELECTROSURGICAL) ×1 IMPLANT
FORCEPS BPLR LNG DVNC XI (INSTRUMENTS) ×1 IMPLANT
FORCEPS LONG TIP 8 DVNC XI (FORCEP) ×1 IMPLANT
FORCEPS MICRO DMD BLK DVNC XI (FORCEP) IMPLANT
FORCEPS PROGRASP DVNC XI (FORCEP) ×1 IMPLANT
GAUZE 4X4 16PLY ~~LOC~~+RFID DBL (SPONGE) IMPLANT
GAUZE PETROLATUM 1 X8 (GAUZE/BANDAGES/DRESSINGS) ×1 IMPLANT
GLOVE BIO SURGEON STRL SZ7 (GLOVE) ×1 IMPLANT
GLOVE BIOGEL PI IND STRL 7.0 (GLOVE) ×3 IMPLANT
GLOVE ECLIPSE 6.5 STRL STRAW (GLOVE) ×3 IMPLANT
HOLDER FOLEY CATH W/STRAP (MISCELLANEOUS) IMPLANT
IRRIG SUCT STRYKERFLOW 2 WTIP (MISCELLANEOUS) ×1
IRRIGATION SUCT STRKRFLW 2 WTP (MISCELLANEOUS) ×1 IMPLANT
KIT PINK PAD W/HEAD ARE REST (MISCELLANEOUS) ×1
KIT PINK PAD W/HEAD ARM REST (MISCELLANEOUS) ×1 IMPLANT
KIT TURNOVER CYSTO (KITS) ×1 IMPLANT
LEGGING LITHOTOMY PAIR STRL (DRAPES) ×1 IMPLANT
NDL HYPO 22X1.5 SAFETY MO (MISCELLANEOUS) ×1 IMPLANT
NEEDLE HYPO 22X1.5 SAFETY MO (MISCELLANEOUS) ×1 IMPLANT
OBTURATOR OPTICAL STND 8 DVNC (TROCAR) ×1
OBTURATOR OPTICALSTD 8 DVNC (TROCAR) ×1 IMPLANT
OCCLUDER COLPOPNEUMO (BALLOONS) ×1 IMPLANT
PACK ROBOT WH (CUSTOM PROCEDURE TRAY) ×1 IMPLANT
PACK ROBOTIC GOWN (GOWN DISPOSABLE) ×1 IMPLANT
PAD OB MATERNITY 4.3X12.25 (PERSONAL CARE ITEMS) ×1 IMPLANT
PAD PREP 24X48 CUFFED NSTRL (MISCELLANEOUS) ×1 IMPLANT
POUCH LAPAROSCOPIC INSTRUMENT (MISCELLANEOUS) ×1 IMPLANT
PROTECTOR NERVE ULNAR (MISCELLANEOUS) ×3 IMPLANT
SCISSORS MNPLR CVD DVNC XI (INSTRUMENTS) ×1 IMPLANT
SEAL UNIV 5-12 XI (MISCELLANEOUS) ×4 IMPLANT
SEALER VESSEL EXT DVNC XI (MISCELLANEOUS) IMPLANT
SET IRRIG Y TYPE TUR BLADDER L (SET/KITS/TRAYS/PACK) IMPLANT
SET TRI-LUMEN FLTR TB AIRSEAL (TUBING) ×1 IMPLANT
SLEEVE SCD COMPRESS KNEE MED (STOCKING) ×1 IMPLANT
SPIKE FLUID TRANSFER (MISCELLANEOUS) ×2 IMPLANT
STRIP CLOSURE SKIN 1/4X4 (GAUZE/BANDAGES/DRESSINGS) ×1 IMPLANT
SUT MNCRL AB 3-0 PS2 27 (SUTURE) ×2 IMPLANT
SUT VIC AB 0 CT1 27 (SUTURE) ×2
SUT VIC AB 0 CT1 27XBRD ANBCTR (SUTURE) ×2 IMPLANT
SUT VIC AB 3-0 CT1 27 (SUTURE) ×1
SUT VIC AB 3-0 CT1 TAPERPNT 27 (SUTURE) IMPLANT
SUT VIC AB 3-0 SH 27 (SUTURE) ×1
SUT VIC AB 3-0 SH 27XBRD (SUTURE) IMPLANT
SUT VICRYL 0 UR6 27IN ABS (SUTURE) ×1 IMPLANT
SUT VLOC 180 0 9IN GS21 (SUTURE) ×2 IMPLANT
SYR TOOMEY IRRIG 70ML (MISCELLANEOUS) ×1
SYRINGE TOOMEY IRRIG 70ML (MISCELLANEOUS) IMPLANT
TIP RUMI ORANGE 6.7MMX12CM (TIP) IMPLANT
TIP UTERINE 5.1X6CM LAV DISP (MISCELLANEOUS) IMPLANT
TIP UTERINE 6.7X10CM GRN DISP (MISCELLANEOUS) IMPLANT
TIP UTERINE 6.7X6CM WHT DISP (MISCELLANEOUS) IMPLANT
TIP UTERINE 6.7X8CM BLUE DISP (MISCELLANEOUS) IMPLANT
TOWEL OR 17X24 6PK STRL BLUE (TOWEL DISPOSABLE) ×1 IMPLANT
TROCAR PORT AIRSEAL 8X120 (TROCAR) ×1 IMPLANT
WATER STERILE IRR 1000ML POUR (IV SOLUTION) ×1 IMPLANT

## 2022-11-18 NOTE — Op Note (Signed)
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 Tacy Dura  Procedure: Robotically assisted total hysterectomy with bilateral salpingo-oophoprectomy and cystoscoopy  Surgeon: Dr. Dois Davenport Jaleal Schliep  Assistant: Henreitta Leber P.A.-C.  Estimated blood loss: 50 cc  Procedure:  After being informed of the planned procedure with possible complications including but not limited to bleeding, infection, injury to other organs, need for laparotomy, expected hospital stay and recovery, informed consent is obtained and patient is taken to or #5. She is placed in  lithotomy position on Pink Pad with both arms padded and tucked on each side and bilateral knee-high sequential compressive devices. She is given general anesthesia with endotracheal intubation without any complication. She is prepped and draped in a sterile fashion. A three-way Foley catheter is inserted in her bladder.  Pelvic exam reveals: anteverted uterus with no mobility and 2 normal adnexa.  A weighted speculum is inserted in the vagina and the anterior lip of the cervix is grasped with a tenaculum forcep.  We find the cervix to be extremely anterior and hard to reach.  We proceed with a paracervical block using ropivacaine 0.5% diluted 1 in 1 with saline. The uterus was then sounded at 8 cm. We easily dilate the cervix using Hegar dilator to  #27.  Multiple attempts at inserting a Rumi intrauterine manipulator with a 3.0 Koh ring failed due to none mobility of the uterus as well as limited space in the vagina.  We proceeded with a 2 cm episiotomy and are still unable to insert Rumi and Koh ring.  We then moved to the Advincula intra uterine manipulator with a 3.0 Koh ring and a vaginal occluder.   Trocar placement is decided. We infiltrate at the umbilicus with 10 cc of ropivacaine per protocol and  perform a 10 mm vertical incision which is brought down bluntly to the fascia. The fascia is identified and grasped with Coker forceps. The fascia is incised with Mayo scissors. Peritoneum is entered bluntly. A pursestring suture of 0 Vicryl is placed on the fascia and a 10 mm Hassan trocar is easily inserted in the abdominal cavity held in placed with a Purstring suture. This allows for easy insufflation of a pneumoperitoneum using warmed CO2 at a maximum pressure of 15 mm of mercury. 60 cc of Ropivacaine 0.5 % diluted 1 in 1 is sent in the pelvis and the patient is positioned in reverse Trendelenburg. We then placed two 8mm robotic trocar on the left, one 8mm robotic trocar on the right and one 8 mm patient's side assistant trocar on the right  after infiltrating every site  with ropivacaine per protocol. The robot is docked on the right of the patient after positioning her in Trendelenburg. A Vessel Seal is inserted in arm #4, a Long bipolar forceps is inserted in arm #2 and a monopolar scissors is inserted in arm #1.  Preparation and docking is completed in 1 hour 11 minutes.  Observation: We find adhesions between the omentum and the anterior abdominal wall approximately 2 cm below the umbilicus.  Then we note the fundus of the uterus completely sutured to the abdominal wall explaining her difficulty reaching vaginally.  Tubes are normal both ovaries are normal posterior cul-de-sac is normal anterior cul-de-sac is not excepted accessible at this time appendix is not visualized liver is normal.  After sharply dissecting the omental adhesions away, we  start on the right  side by sealing and cutting the infundibular ligament after locating the ureter on its full course.  We seal and suction the round ligament.  This gives [place] entry into the retroperitoneal space.  We are unable to identify the bladder at this time.  We proceed in the exact same way for the left side also locating the left ureter on its full  course.  We now proceed with systematic dissection of the adhesions between the uterine fundus and the abdominal wall.  The bladder is filled with 200 cc of saline.  This allows [place] to sharply and bluntly identify the anterior cul-de-sac and dissecting the bladder below the [facility] ring.  We can now address the posterior broad ligament all the way to the uterine vessels.  After draining the bladder we are able to cauterize and section uterine vessels on both sides at the level of the [place] ring.  The vaginal occluder is inflated and we proceed with a 360 colpotomy using an open monopolar scissors and freeing the uterus entirely.  The uterus with both tubes and ovaries is delivered vaginally with traction only. The vaginal occluder is reinserted in the vagina to maintain pneumoperitoneum.  Instruments are then modified for a suture cut in arm #4 and a long tip forcep in arm #2. We proceed with closure of the vaginal cuff with 2 running sutures of 0 V-Lock. We irrigated profusely with warm saline and confirm a satisfactory hemostasis as well as 2 normal ureters with good mobility and no dilatation.  A sheet of Interceed , divided in 2, is placed on the vaginal cuff.  All instruments are then removed and the robot is undocked. Console time: 1 hours and 51 minutes.  All trochars are removed under direct visualization after evacuating the pneumoperitoneum.  The fascia of the umbilical incision is closed with the previously placed pursestring suture of 0 Vicryl. All incisions are then closed with subcuticular suture of 3-0 Monocryl and Dermabond.  We proceeded with cystoscopy to confirm a intact bladder to normal ureteral openings and ureteral jets.  A speculum is inserted in the vagina to confirm a adequate closure of the vaginal cuff and good hemostasis. We closed the episiotomy with a running suture of 3-0 Vicryl.  Instrument and sponge count is complete x2. The procedure is well tolerated by the  patient is taken to recovery room in a well and stable condition.  Dr Estanislado Pandy was present and scrubbed at all times. Surgical assistance was required due to the complexity of the anatomy and the robotic approach of the procedure.   Estimated blood loss: 50  Specimen: Uterus, tubes and ovaries sent to pathology

## 2022-11-18 NOTE — Anesthesia Postprocedure Evaluation (Signed)
Anesthesia Post Note  Patient: Katherine Gay  Procedure(s) Performed: XI ROBOTIC ASSISTED TOTAL HYSTERECTOMY WITH BILATERAL SALPINGO OOPHORECTOMY LYSIS OF ADHESIONS; CYSTOSCOPY (Bilateral)     Patient location during evaluation: PACU Anesthesia Type: General Level of consciousness: awake and alert Pain management: pain level controlled Vital Signs Assessment: post-procedure vital signs reviewed and stable Respiratory status: spontaneous breathing, nonlabored ventilation, respiratory function stable and patient connected to nasal cannula oxygen Cardiovascular status: blood pressure returned to baseline and stable Postop Assessment: no apparent nausea or vomiting Anesthetic complications: no   No notable events documented.  Last Vitals:  Vitals:   11/18/22 1445 11/18/22 1848  BP: 121/82 (!) 140/75  Pulse: 79 64  Resp: 16 16  Temp: 36.4 C 36.5 C  SpO2: 96% 97%    Last Pain:  Vitals:   11/18/22 1848  TempSrc:   PainSc: 0-No pain                 Kinzie Wickes

## 2022-11-18 NOTE — Progress Notes (Addendum)
11/18/2022 7:47 PM Pt. Has voided x 2 since arrival to [facility], totaling 150 ml. Once was 50 ml, the second was 100 ml. Urine clear yellow, slight burning, and no difficulty with urine stream. Pt. Denies pain or discomfort or feeling that she needs to void and can't. States she feels fine. Pt. Meeting all other criteria to go home. Bladder scan done and revealed 253 ml residual urine in bladder. Dr. Estanislado Pandy called x 2 and voicemail left. Eventually Henreitta Leber PA called and verbal order received ok for patient to be d/c home. No further intervention needed. Orders enacted.  Song Garris, Blanchard Kelch

## 2022-11-18 NOTE — Interval H&P Note (Signed)
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  The various methods of treatment have been discussed with the patient and family. After consideration of risks, benefits and other options for treatment, the patient has consented to  Procedure(s): XI ROBOTIC ASSISTED TOTAL HYSTERECTOMY WITH BILATERAL SALPINGO OOPHORECTOMY (Bilateral) as a surgical intervention.  The patient's history has been reviewed, patient examined, no change in status, stable for surgery.  I have reviewed the patient's chart and labs.  Questions were answered to the patient's satisfaction.     Dois Davenport A Haley Fuerstenberg

## 2022-11-18 NOTE — Discharge Instructions (Addendum)
Call [facility] at [phone] if:   You have a temperature greater than or equal to 100.4 degrees Farenheit orally You have pain that is not made better by the pain medication given and taken as directed You have excessive bleeding or problems urinating  Take Colace (Docusate Sodium/Stool Softener) 100 mg 2-3 times daily while taking narcotic pain medicine to avoid constipation or until bowel movements are regular.  Take with food, Ibuprofen 600 mg tablet  and Acetaminophen #2-500 mg tablets every 6 hours for 5 days then as needed for pain. Take Oxycodone every 4 hours if pain is not controlled with the Ibuprofen/Acetaminophen combination.  You may drive after 2 weeks You may walk up steps  You may shower tomorrow You may resume a regular diet  Keep incisions clean and dry Do not lift over 15 pounds for 6 weeks Avoid anything in vagina for 6 weeks.

## 2022-11-18 NOTE — Anesthesia Procedure Notes (Signed)
Procedure Name: Intubation Date/Time: 11/18/2022 7:37 AM  Performed by: Bishop Limbo, CRNAPre-anesthesia Checklist: Patient identified, Emergency Drugs available, Suction available and Patient being monitored Patient Re-evaluated:Patient Re-evaluated prior to induction Oxygen Delivery Method: Circle System Utilized Preoxygenation: Pre-oxygenation with 100% oxygen Induction Type: IV induction Ventilation: Mask ventilation without difficulty Laryngoscope Size: Mac and 3 Grade View: Grade I Tube type: Oral Tube size: 7.0 mm Number of attempts: 1 Airway Equipment and Method: Stylet and Bite block Placement Confirmation: ETT inserted through vocal cords under direct vision, positive ETCO2 and breath sounds checked- equal and bilateral Secured at: 22 cm Tube secured with: Tape Dental Injury: Teeth and Oropharynx as per pre-operative assessment

## 2022-11-18 NOTE — Transfer of Care (Signed)
Immediate Anesthesia Transfer of Care Note  Patient: Katherine Gay  Procedure(s) Performed: XI ROBOTIC ASSISTED TOTAL HYSTERECTOMY WITH BILATERAL SALPINGO OOPHORECTOMY LYSIS OF ADHESIONS (Bilateral)  Patient Location: PACU  Anesthesia Type:General  Level of Consciousness: drowsy, patient cooperative, and responds to stimulation  Airway & Oxygen Therapy: Patient Spontanous Breathing and Patient connected to face mask oxygen  Post-op Assessment: Report given to RN and Post -op Vital signs reviewed and stable  Post vital signs: Reviewed and stable  Last Vitals:  Vitals Value Taken Time  BP 120/83 11/18/22 1215  Temp    Pulse 74 11/18/22 1218  Resp 13 11/18/22 1218  SpO2 99 % 11/18/22 1218  Vitals shown include unvalidated device data.  Last Pain:  Vitals:   11/18/22 0616  TempSrc: Oral  PainSc: 0-No pain      Patients Stated Pain Goal: 7 (11/18/22 4098)  Complications: No notable events documented.

## 2022-11-19 ENCOUNTER — Encounter (HOSPITAL_BASED_OUTPATIENT_CLINIC_OR_DEPARTMENT_OTHER): Payer: Self-pay | Admitting: Obstetrics and Gynecology

## 2022-11-19 LAB — SURGICAL PATHOLOGY

## 2023-02-22 ENCOUNTER — Telehealth: Payer: Self-pay | Admitting: Hematology and Oncology

## 2023-02-22 NOTE — Telephone Encounter (Signed)
Left patient message regarding rescheduled appointment times/date

## 2023-03-31 ENCOUNTER — Ambulatory Visit (HOSPITAL_COMMUNITY)
Admission: RE | Admit: 2023-03-31 | Discharge: 2023-03-31 | Disposition: A | Discharge status: Home / Self Care | Payer: Commercial Managed Care - PPO | Source: Ambulatory Visit | Attending: Hematology and Oncology | Admitting: Hematology and Oncology

## 2023-03-31 DIAGNOSIS — Z1509 Genetic susceptibility to other malignant neoplasm: Secondary | ICD-10-CM | POA: Diagnosis present

## 2023-03-31 DIAGNOSIS — Z1501 Genetic susceptibility to malignant neoplasm of breast: Secondary | ICD-10-CM | POA: Diagnosis present

## 2023-03-31 MED ORDER — GADOBUTROL 1 MMOL/ML IV SOLN
9.5000 mL | Freq: Once | INTRAVENOUS | Status: AC | PRN
  Administered 2023-03-31: 9.5 mL via INTRAVENOUS

## 2023-04-03 ENCOUNTER — Emergency Department (HOSPITAL_COMMUNITY)
Admission: EM | Admit: 2023-04-03 | Discharge: 2023-04-04 | Disposition: A | Discharge status: Home / Self Care | Payer: Commercial Managed Care - PPO | Attending: Emergency Medicine | Admitting: Emergency Medicine

## 2023-04-03 ENCOUNTER — Encounter (HOSPITAL_COMMUNITY): Payer: Self-pay

## 2023-04-03 ENCOUNTER — Other Ambulatory Visit: Payer: Self-pay

## 2023-04-03 DIAGNOSIS — M545 Low back pain, unspecified: Secondary | ICD-10-CM | POA: Diagnosis not present

## 2023-04-03 DIAGNOSIS — R109 Unspecified abdominal pain: Secondary | ICD-10-CM

## 2023-04-03 DIAGNOSIS — N3000 Acute cystitis without hematuria: Secondary | ICD-10-CM | POA: Diagnosis not present

## 2023-04-03 DIAGNOSIS — R0781 Pleurodynia: Secondary | ICD-10-CM | POA: Insufficient documentation
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
## 2023-04-04 ENCOUNTER — Emergency Department (HOSPITAL_COMMUNITY): Payer: Commercial Managed Care - PPO

## 2023-04-04 LAB — URINALYSIS, ROUTINE W REFLEX MICROSCOPIC
Bilirubin Urine: NEGATIVE
Glucose, UA: NEGATIVE mg/dL
Ketones, ur: NEGATIVE mg/dL
Nitrite: POSITIVE — AB
Protein, ur: NEGATIVE mg/dL
Specific Gravity, Urine: 1.02 (ref 1.005–1.030)
pH: 5 (ref 5.0–8.0)

## 2023-04-04 MED ORDER — KETOROLAC TROMETHAMINE 30 MG/ML IJ SOLN
30.0000 mg | Freq: Once | INTRAMUSCULAR | Status: AC
  Administered 2023-04-04: 30 mg via INTRAMUSCULAR
  Filled 2023-04-04: qty 1

## 2023-04-04 MED ORDER — CYCLOBENZAPRINE HCL 10 MG PO TABS: 10.0000 mg | ORAL_TABLET | Freq: Three times a day (TID) | ORAL | 0 refills | Status: AC | PRN

## 2023-04-04 MED ORDER — CEPHALEXIN 500 MG PO CAPS: 500.0000 mg | ORAL_CAPSULE | Freq: Two times a day (BID) | ORAL | 0 refills | Status: AC

## 2023-04-04 MED ORDER — IBUPROFEN 600 MG PO TABS: 600.0000 mg | ORAL_TABLET | Freq: Four times a day (QID) | ORAL | 0 refills | Status: AC | PRN

## 2023-04-04 MED ORDER — CEPHALEXIN 500 MG PO CAPS
500.0000 mg | ORAL_CAPSULE | Freq: Once | ORAL | Status: AC
  Administered 2023-04-04: 500 mg via ORAL
  Filled 2023-04-04: qty 1
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
## 2023-04-08 ENCOUNTER — Ambulatory Visit: Payer: Commercial Managed Care - PPO | Admitting: Hematology and Oncology

## 2023-04-09 IMAGING — DX DG CHEST 1V PORT
1 series · 1 of 1 positions shown · non-contrast
Comparison: CT abdomen and pelvis-10/14/2021

CLINICAL DATA: Fever after recent urologic procedure. Questionable
sepsis.

EXAM:
PORTABLE CHEST 1 VIEW

[chest ap]
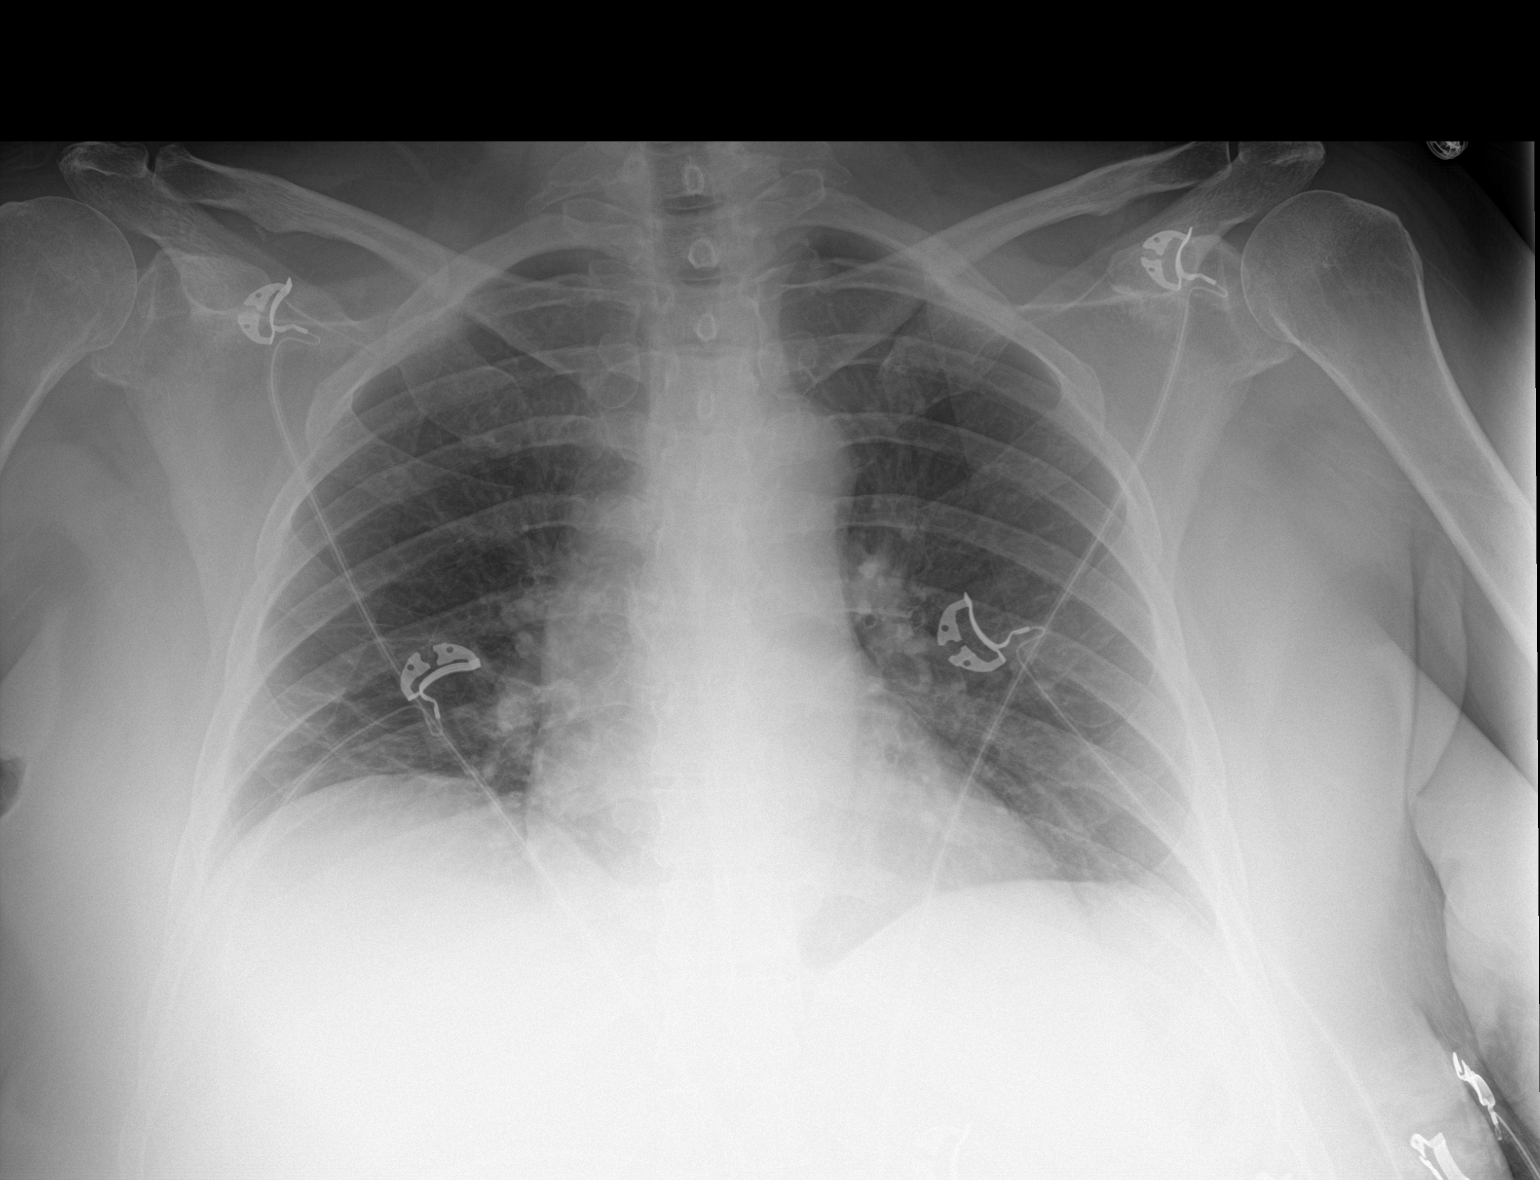

[1 of 1 positions shown; findings below may reference images not displayed]

FINDINGS: Examination is degraded due to patient body habitus and portable
technique.

Normal cardiac silhouette and mediastinal contours given reduced
lung volumes and AP projection. Minimal bibasilar heterogeneous
opacities favored to represent atelectasis. No discrete focal
airspace opacities. No pleural effusion or pneumothorax. No evidence
of edema. No acute osseous abnormalities.
IMPRESSION: Bibasilar atelectasis without superimposed acute cardiopulmonary
disease on this hypoventilated examination.

## 2023-04-13 ENCOUNTER — Inpatient Hospital Stay: Payer: Commercial Managed Care - PPO | Attending: Hematology and Oncology | Admitting: Hematology and Oncology

## 2023-04-13 IMAGING — DX DG ABDOMEN 1V
1 series · 1 of 1 positions shown · non-contrast
Comparison: 10/14/2021

CLINICAL DATA: Known ureteral stent and fevers

EXAM:
ABDOMEN - 1 VIEW

[abdomen kub]
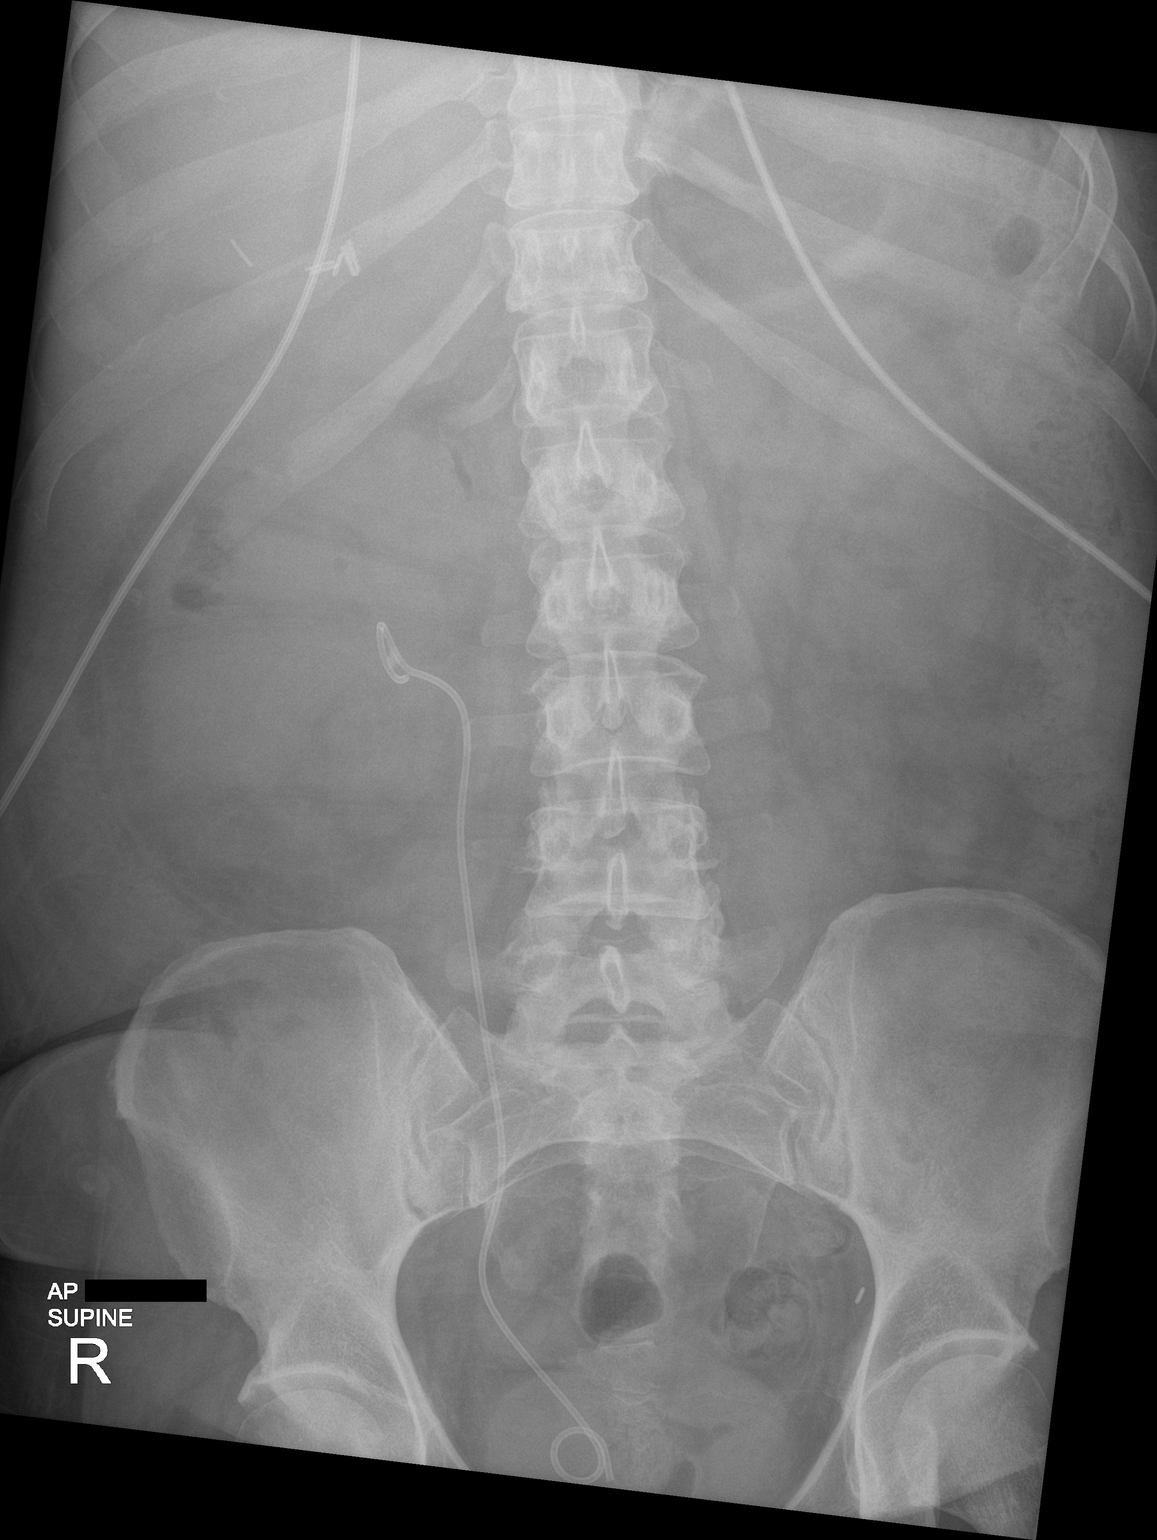

[1 of 1 positions shown; findings below may reference images not displayed]

FINDINGS: Scattered large and small bowel gas is noted. Right ureteral stent
is noted in satisfactory position. No definitive renal or ureteral
stones are seen. No bony abnormality is noted.
IMPRESSION: Right ureteral stent in place.  No other focal abnormality is noted.

## 2023-04-23 IMAGING — CT CT RENAL STONE PROTOCOL
2 of 3 series · 16 of 46 positions shown, 18 images · non-contrast
Comparison: CT October 14, 2021.

CLINICAL DATA: Right flank pain concern for renal stone recent
cystoscopy with ureteral stent placement.



[Series 4: lung bases · axial · 0.98mm/px · z∈[+1179,+1359]mm · 13 of 104 slices shown, 15 images]
[im 7/104  soft-tissue]
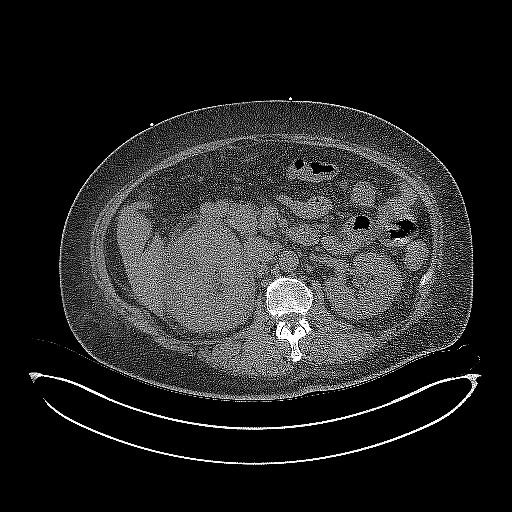
[im 7/104  bone]
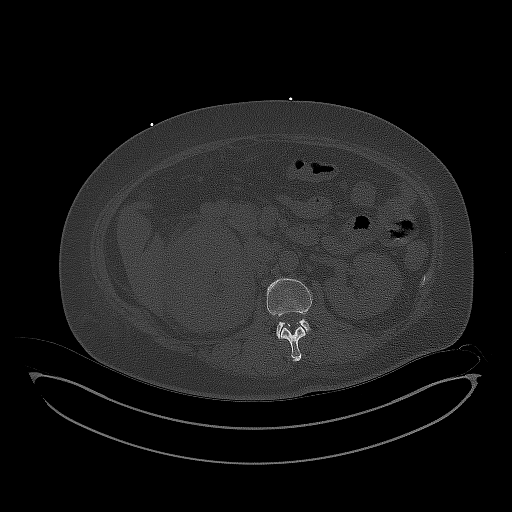
[im 14/104  soft-tissue]
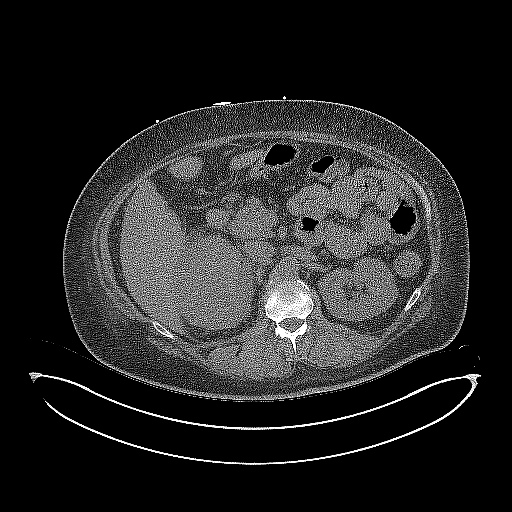
[im 20/104  soft-tissue]
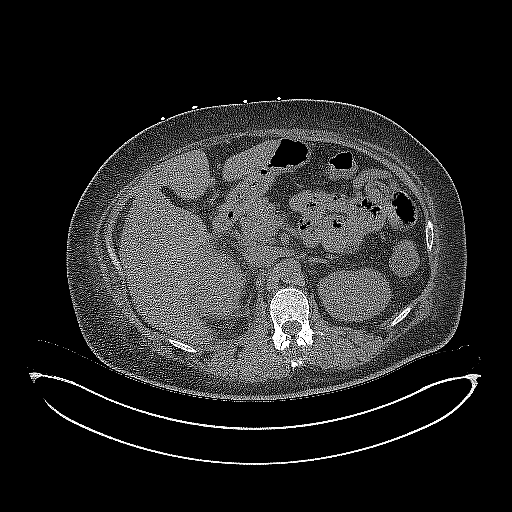
[im 30/104  soft-tissue]
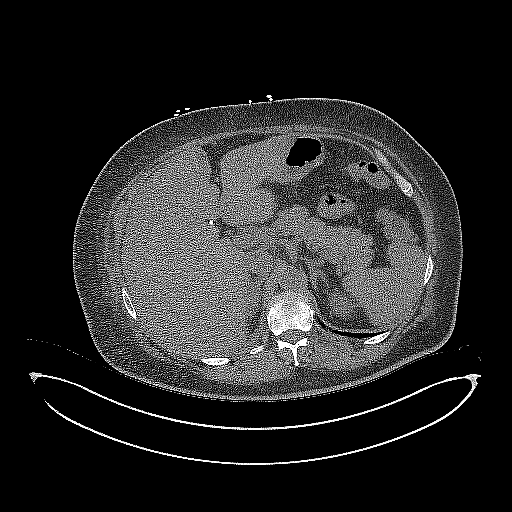
[im 37/104  soft-tissue]
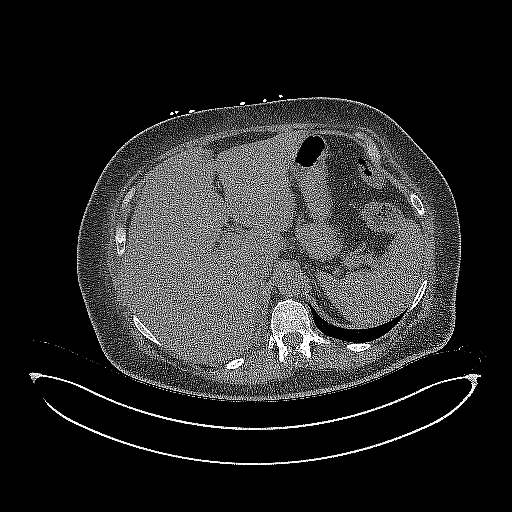
[im 44/104  soft-tissue]
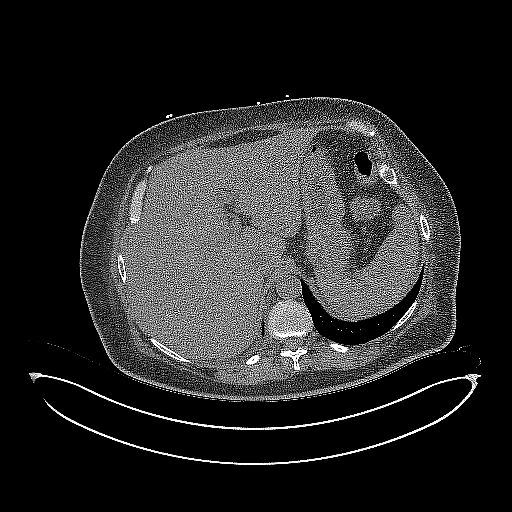
[im 54/104  soft-tissue]
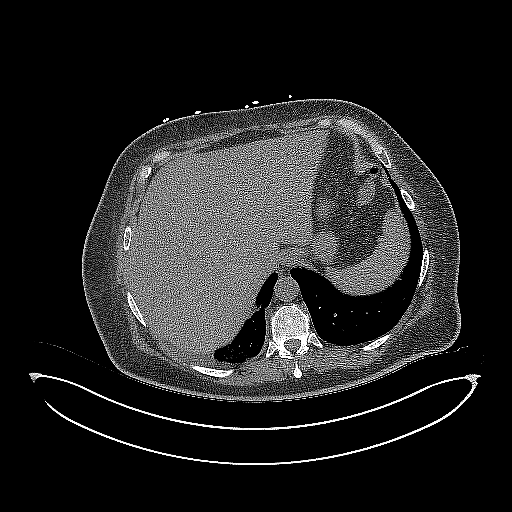
[im 60/104  soft-tissue]
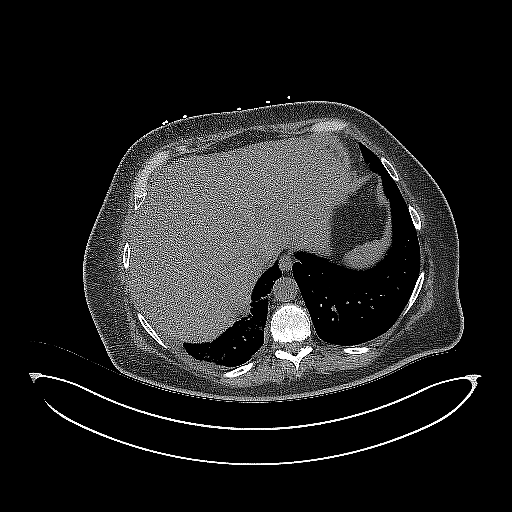
[im 67/104  soft-tissue]
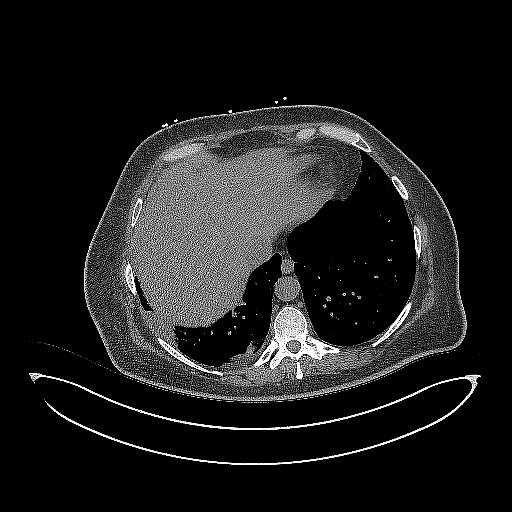
[im 67/104  bone]
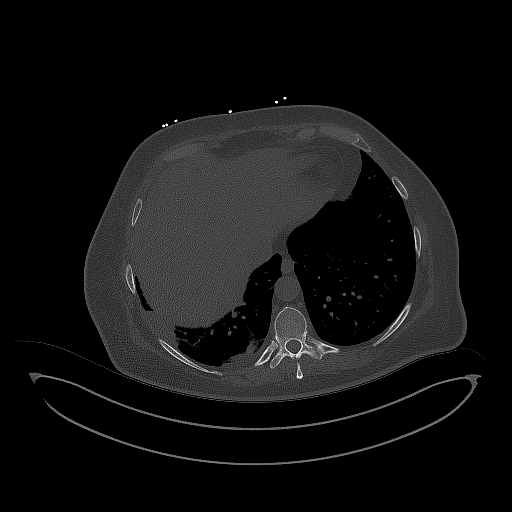
[im 74/104  soft-tissue]
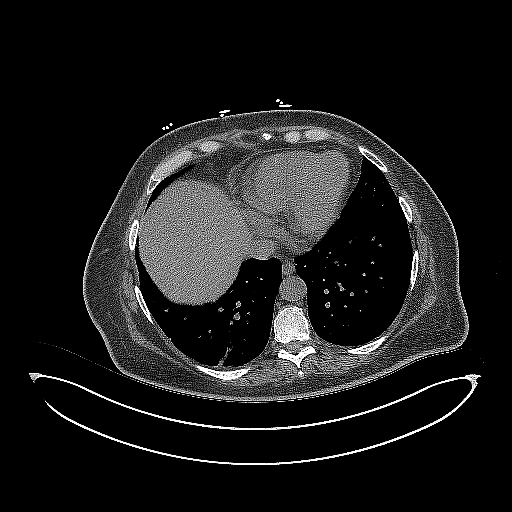
[im 84/104  soft-tissue]
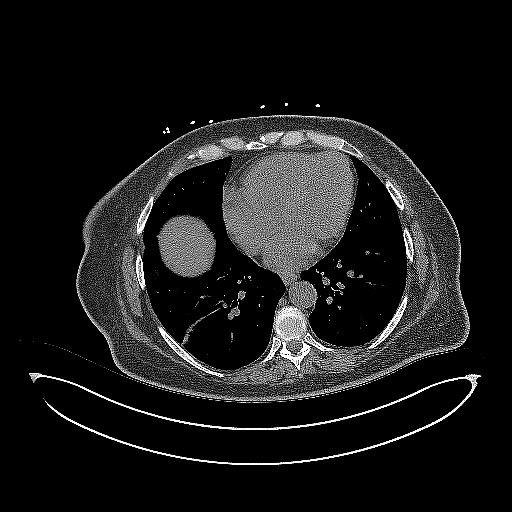
[im 90/104  soft-tissue]
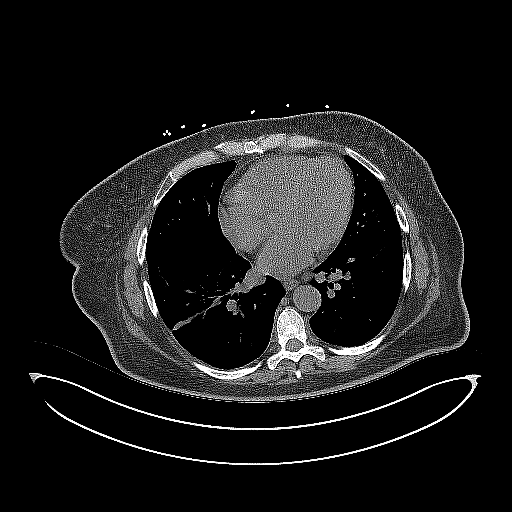
[im 97/104  soft-tissue]
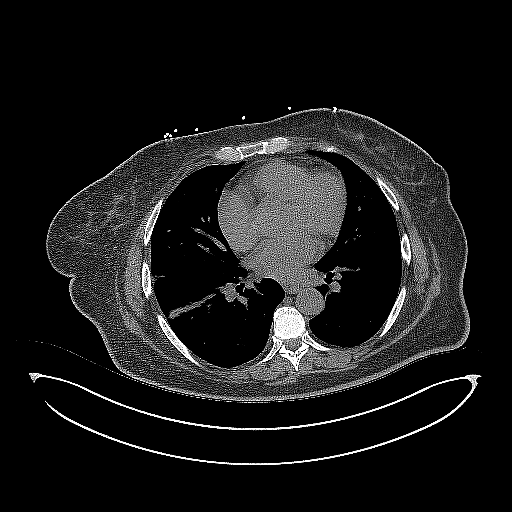

[Series 5: coronal · coronal · 0.90mm/px · 3 of 162 slices shown]
[im 54/162  soft-tissue]
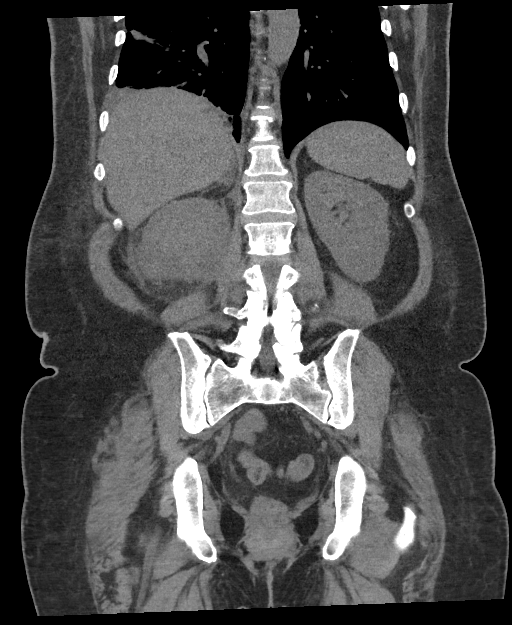
[im 72/162  soft-tissue]
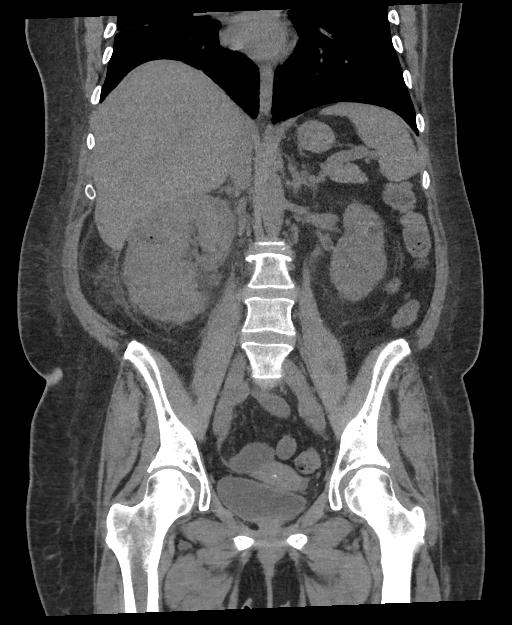
[im 90/162  soft-tissue]
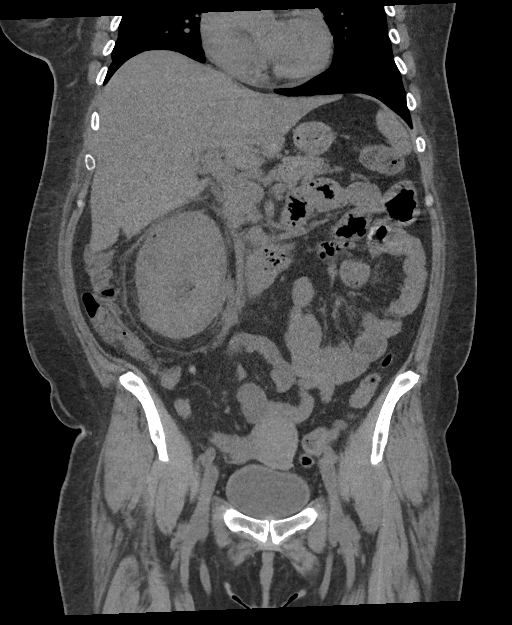

[16 of 46 positions shown; findings below may reference images not displayed]

FINDINGS: Lower chest: Right lower lobe atelectasis.

Hepatobiliary: Focal hypodensity along the falciform ligament is in
a region commonly associated with focal fatty infiltration and
similar prior imaging. Gallbladder is unremarkable. No biliary
ductal dilation.

Pancreas: No pancreatic ductal dilation or evidence of acute
inflammation.

Spleen: No splenomegaly or focal splenic lesion.

Adrenals/Urinary Tract: Bilateral adrenal glands appear normal.

Prominence of the right renal collecting system and ureter with
urothelial thickening, no ureteral stent is visualized. Edematous
right kidney with a perinephric gas and fluid collection which
extends along the periphery of the kidney measuring up to 2.8 cm in
thickness on image 43/2.

No left-sided hydronephrosis. No renal, ureteral or bladder calculi
identified.

Small volume gas in the urinary bladder.

Stomach/Bowel: No radiopaque enteric contrast material was
administered. Stomach is minimally distended limiting evaluation. No
pathologic dilation of small or large bowel. Terminal ileum and
appendix appear normal. No evidence of acute bowel inflammation.

Vascular/Lymphatic: Aortic atherosclerosis without abdominal aortic
aneurysm. No pathologically enlarged abdominal or pelvic lymph
nodes.

Reproductive: Uterus and bilateral adnexa are unremarkable.

Other: Right-sided perinephric stranding.  No pneumoperitoneum.

Musculoskeletal: Multilevel degenerative changes spine. No acute
osseous abnormality.
IMPRESSION: 1. Edematous right kidney with prominence of the right renal
collecting system/ureter and urothelial thickening with a
perinephric gas and fluid collection measuring up to 2.8 cm in
thickness. Findings are concerning for a pyelonephritis with
perinephric abscess, correlation with laboratory values and urology
consult suggested.
2. Small volume gas in the urinary bladder.
3.  Aortic Atherosclerosis ([id number]).

## 2023-05-25 ENCOUNTER — Inpatient Hospital Stay: Payer: Commercial Managed Care - PPO | Attending: Hematology and Oncology | Admitting: Hematology and Oncology

## 2023-05-25 VITALS — BP 138/85 | HR 98 | Temp 97.3°F | Resp 16 | Wt 208.0 lb

## 2023-05-25 DIAGNOSIS — Z79899 Other long term (current) drug therapy: Secondary | ICD-10-CM | POA: Diagnosis not present

## 2023-05-25 DIAGNOSIS — I10 Essential (primary) hypertension: Secondary | ICD-10-CM | POA: Diagnosis not present

## 2023-05-25 DIAGNOSIS — Z1502 Genetic susceptibility to malignant neoplasm of ovary: Secondary | ICD-10-CM | POA: Insufficient documentation

## 2023-05-25 DIAGNOSIS — Z803 Family history of malignant neoplasm of breast: Secondary | ICD-10-CM | POA: Insufficient documentation

## 2023-05-25 DIAGNOSIS — Z1509 Genetic susceptibility to other malignant neoplasm: Secondary | ICD-10-CM

## 2023-05-25 DIAGNOSIS — Z9049 Acquired absence of other specified parts of digestive tract: Secondary | ICD-10-CM | POA: Diagnosis not present

## 2023-05-25 DIAGNOSIS — Z90710 Acquired absence of both cervix and uterus: Secondary | ICD-10-CM | POA: Diagnosis not present

## 2023-05-25 DIAGNOSIS — Z1501 Genetic susceptibility to malignant neoplasm of breast: Secondary | ICD-10-CM | POA: Diagnosis not present

## 2023-05-25 DIAGNOSIS — Z7984 Long term (current) use of oral hypoglycemic drugs: Secondary | ICD-10-CM | POA: Diagnosis not present

## 2023-05-25 DIAGNOSIS — E119 Type 2 diabetes mellitus without complications: Secondary | ICD-10-CM | POA: Insufficient documentation

## 2023-05-25 NOTE — Progress Notes (Signed)
[facility] CONSULT NOTE  Patient Care Team: Stamey, Verda Cumins, FNP as PCP - General (Family Medicine)  CHIEF COMPLAINTS/PURPOSE OF CONSULTATION:  BRCA 2 genetic mutation.  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    BRCA2 Mutation Undergoing intensified screening to catch potential breast cancer early. MRI completed, mammogram due in April. Discussed the potential future use of contrast enhanced mammograms. -Continue annual MRI and mammogram screenings. -Consider Tamoxifen for prevention of estrogen positive breast cancers. Patient to research and call if interested in starting.  Hysterectomy Completed in June with no complications. Ovaries appeared normal with no signs of cancer. -No further action required at this time.  Colon Cancer Screening Difficulty with colonoscopy prep leading to incomplete procedures. Completed Cologuard last year with negative results. -Continue with non-invasive screening methods such as Cologuard.  General Health Maintenance -Next appointment scheduled for one year.  HISTORY OF PRESENTING ILLNESS:  Katherine Gay 52 y.o. female is here because of BRCA 2 mutation.  Katherine Gay has significant family history of breast cancer in mom at the age of 78 who also happened to have ovarian cancer.  Maternal uncle had prostate cancer.  Maternal grandmother had breast cancer at a very early age at the age of 51.  She has proceeded with genetic testing which showed a pathogenic variant in BRCA2.  She also has specific variant in the FH gene as well.   Discussed the use of AI scribe software for clinical note transcription with the patient, who gave verbal consent to proceed.  History of Present Illness         The patient, with a known BRCA2 mutation, presents for a routine  follow-up. She reports having undergone a hysterectomy in June, with no complications and no evidence of cancer in the ovaries. The patient is considering a bilateral mastectomy due to the stress of annual screenings and the potential for early detection rather than prevention of breast cancer. She also expresses concern about the MRI procedure, finding it unpleasant, but acknowledges the necessity due to her genetic risk.  The patient also discusses her diabetes management, with her primary care physician recommending starting on a medication, possibly metformin. She expresses hesitation due to potential interactions with alcohol and caffeine, although she only drinks alcohol about three times a year.  The patient also mentions a family history of breast cancer, with her mother having had a lumpectomy and also carrying the BRCA2 mutation. She expresses concern about her mother's screening regimen, which only includes an annual mammogram.  MEDICAL HISTORY:  Past Medical History:  Diagnosis Date   Anemia    hx of anemia, 11/01/21 Hgb 8.7   BRCA2 gene mutation positive 06/28/2022   Follows w/ Dr. Rachel Moulds, [facility], oncologist.   Depression    hx of, resolved as of 11/02/22 per pt   History of kidney stones    HSV-2 infection    HTN (hypertension)    Follows w/ Kae Heller, PA @ [facility].   Nephrolithiasis    bilateral -- nonobstructive per ct 11-27-2014   Right ureteral stone    Sepsis (HCC)    Secondary to infected renal stone. Hospitalized 10/2021.   Type 2 diabetes mellitus (HCC)    Follows w/ Kae Heller, PA @ [facility].   Urinary frequency  SURGICAL HISTORY: Past Surgical History:  Procedure Laterality Date   CESAREAN SECTION  2004   CYSTOSCOPY W/ URETERAL STENT PLACEMENT Right 10/16/2021   Procedure: CYSTOSCOPY WITH RETROGRADE PYELOGRAM/URETERAL STENT PLACEMENT;  Surgeon: Jerilee Field, MD;  Location: [facility];  Service: Urology;  Laterality: Right;   CYSTOSCOPY WITH  RETROGRADE PYELOGRAM, URETEROSCOPY AND STENT PLACEMENT Right 08/01/2015   Procedure: CYSTOSCOPY WITH RIGHT RETROGRADE PYELOGRAM, URETEROSCOPY AND STENT PLACEMENT;  Surgeon: Crist Fat, MD;  Location[facility];  Service: Urology;  Laterality: Right;   HOLMIUM LASER APPLICATION Right 08/01/2015   Procedure: HOLMIUM LASER APPLICATION;  Surgeon: Crist Fat, MD;  Location: [facility];  Service: Urology;  Laterality: Right;   LAPAROSCOPIC CHOLECYSTECTOMY  08-13-2009   ROBOTIC ASSISTED TOTAL HYSTERECTOMY WITH BILATERAL SALPINGO OOPHERECTOMY Bilateral 11/18/2022   Procedure: XI ROBOTIC ASSISTED TOTAL HYSTERECTOMY WITH BILATERAL SALPINGO OOPHORECTOMY LYSIS OF ADHESIONS; CYSTOSCOPY;  Surgeon: Silverio Lay, MD;  Location: [facility];  Service: Gynecology;  Laterality: Bilateral;   WISDOM TOOTH EXTRACTION  2011    SOCIAL HISTORY: Social History   Socioeconomic History   Marital status: Married    Spouse name: Not on file   Number of children: Not on file   Years of education: Not on file   Highest education level: Not on file  Occupational History   Not on file  Tobacco Use   Smoking status: Never   Smokeless tobacco: Never  Vaping Use   Vaping status: Never Used  Substance and Sexual Activity   Alcohol use: Yes    Comment: Very rarely   Drug use: No   Sexual activity: Not on file  Other Topics Concern   Not on file  Social History Narrative   ** Merged History Encounter **       ** Merged History Encounter **       Social Drivers of Corporate investment banker Strain: Not on file  Food Insecurity: Not on file  Transportation Needs: Not on file  Physical Activity: Not on file  Stress: Not on file  Social Connections: Not on file  Intimate Partner Violence: Not on file    FAMILY HISTORY: Family History  Problem Relation Age of Onset   Breast cancer Mother 78   Ovarian cancer Mother 73   Uterine cancer Mother 11    Squamous cell carcinoma Mother 16       hand   Prostate cancer Maternal Uncle        dx < 60   Cancer Paternal Uncle        x2 pat uncles; unknown type   Breast cancer Maternal Grandmother 76   Breast cancer Other 53       MGM's sister   Prostate cancer Other        MGM's brother; dx 53s   Testicular cancer Cousin        maternal cousin; dx 30s    ALLERGIES:  is allergic to cat hair extract and pollen extract.  MEDICATIONS:  Current Outpatient Medications  Medication Sig Dispense Refill   acetaminophen (TYLENOL) 500 MG tablet Take 2 tablets po every 6 hours for 5 days then prn post operative pain 100 tablet 1   atorvastatin (LIPITOR) 20 MG tablet Take 1 tablet (20 mg total) by mouth daily. (Patient not taking: Reported on 11/02/2022) 90 tablet 3   Calcium Carbonate Antacid (TUMS PO) Take 1-2 tablets by mouth daily as needed (acid reflux).     cephALEXin (  KEFLEX) 500 MG capsule Take 1 capsule (500 mg total) by mouth 2 (two) times daily. 20 capsule 0   clobetasol ointment (TEMOVATE) 0.05 % Apply 1 application. topically daily as needed (dermatitis).     cyclobenzaprine (FLEXERIL) 10 MG tablet Take 1 tablet (10 mg total) by mouth 3 (three) times daily as needed for muscle spasms. 10 tablet 0   Docosanol (ABREVA EX) Apply 1 application. topically 3 (three) times daily as needed (cold sore).     ibuprofen (ADVIL) 600 MG tablet take 1 tablet po  pc every 6 hours for 5 days for post operative pain then prn 30 tablet 1   ibuprofen (ADVIL) 600 MG tablet Take 1 tablet (600 mg total) by mouth every 6 (six) hours as needed. 30 tablet 0   linagliptin (TRADJENTA) 5 MG TABS tablet Take 1 tablet (5 mg total) by mouth daily. (Patient not taking: Reported on 10/30/2021) 30 tablet 1   loratadine (CLARITIN) 10 MG tablet Take 10 mg by mouth daily as needed for allergies.     losartan (COZAAR) 100 MG tablet Take 1 tablet (100 mg total) by mouth daily. 90 tablet 1   metFORMIN (GLUCOPHAGE) 500 MG tablet  Take 1,000 mg by mouth 2 (two) times daily with a meal.      ondansetron (ZOFRAN-ODT) 8 MG disintegrating tablet Take 1 tablet (8 mg total) by mouth every 8 (eight) hours as needed. 10 tablet 0   oxyCODONE (ROXICODONE) 5 MG immediate release tablet take 1 tablet po every 6 hours as needed for breakthrough post operative pain 16 tablet 0   RYBELSUS 7 MG TABS Take 2 tablets by mouth daily. (Patient taking differently: Take 14 mg by mouth daily.) 60 tablet 2   triamcinolone cream (KENALOG) 0.1 % Apply 1 application  topically See admin instructions. 1 application 1- times daily as needed for exzema on hands     valACYclovir (VALTREX) 500 MG tablet Take 500-1,000 mg by mouth See admin instructions. Take 500-1000mg  by mouth 2-3 times a day as needed for a flare up     No current facility-administered medications for this visit.     PHYSICAL EXAMINATION: ECOG PERFORMANCE STATUS: 0 - Asymptomatic  Vitals:   05/25/23 1541  BP: 138/85  Pulse: 98  Resp: 16  Temp: (!) 97.3 F (36.3 C)  SpO2: 100%   Filed Weights   05/25/23 1541  Weight: 208 lb (94.3 kg)    GENERAL:alert, no distress and comfortable Breast: Bilateral breasts inspected and palpated. No palpable breast masses No regional adenopathy. No LE edema.  LABORATORY DATA:  I have reviewed the data as listed Lab Results  Component Value Date   WBC 6.8 11/16/2022   HGB 12.3 11/16/2022   HCT 37.6 11/16/2022   MCV 87.9 11/16/2022   PLT 337 11/16/2022     Chemistry      Component Value Date/Time   NA 136 11/16/2022 1430   K 4.0 11/16/2022 1430   CL 103 11/16/2022 1430   CO2 24 11/16/2022 1430   BUN 16 11/16/2022 1430   CREATININE 1.01 (H) 11/16/2022 1430      Component Value Date/Time   CALCIUM 9.0 11/16/2022 1430   ALKPHOS 48 10/31/2021 0354   AST 12 (L) 10/31/2021 0354   ALT 9 10/31/2021 0354   BILITOT 0.5 10/31/2021 0354       RADIOGRAPHIC STUDIES: I have personally reviewed the radiological images as listed  and agreed with the findings in the report. No results found.  All questions were answered. The patient knows to call the clinic with any problems, questions or concerns. I spent 30 minutes in the care of this patient including H and P, review of records, counseling and coordination of care.     Rachel Moulds, MD 05/25/2023 3:57 PM

## 2023-07-13 ENCOUNTER — Other Ambulatory Visit: Payer: Self-pay | Admitting: *Deleted

## 2023-07-13 MED ORDER — TAMOXIFEN CITRATE 20 MG PO TABS: 20.0000 mg | ORAL_TABLET | Freq: Every day | ORAL | 3 refills | Status: DC

## 2023-07-15 ENCOUNTER — Telehealth: Payer: Self-pay | Admitting: Hematology and Oncology

## 2023-07-15 NOTE — Telephone Encounter (Signed)
 Scheduled patient's appts. Advised patient to contact us if rescheduling is needed.

## 2023-08-25 ENCOUNTER — Telehealth: Payer: Self-pay | Admitting: Hematology and Oncology

## 2023-08-26 ENCOUNTER — Inpatient Hospital Stay: Payer: Commercial Managed Care - PPO | Admitting: Hematology and Oncology

## 2023-09-26 ENCOUNTER — Other Ambulatory Visit: Payer: Self-pay | Admitting: *Deleted

## 2023-09-26 ENCOUNTER — Other Ambulatory Visit: Payer: Self-pay | Admitting: Obstetrics and Gynecology

## 2023-09-26 DIAGNOSIS — Z1231 Encounter for screening mammogram for malignant neoplasm of breast: Secondary | ICD-10-CM

## 2023-09-26 MED ORDER — TAMOXIFEN CITRATE 20 MG PO TABS: 20.0000 mg | ORAL_TABLET | Freq: Every day | ORAL | 3 refills | Status: DC

## 2023-10-15 ENCOUNTER — Other Ambulatory Visit: Payer: Self-pay | Admitting: Hematology and Oncology

## 2023-11-07 ENCOUNTER — Ambulatory Visit

## 2023-11-10 ENCOUNTER — Telehealth: Payer: Self-pay

## 2023-11-10 NOTE — Telephone Encounter (Signed)
 Confirmed appt for 6/9

## 2023-11-14 ENCOUNTER — Inpatient Hospital Stay: Attending: Hematology and Oncology | Admitting: Hematology and Oncology

## 2023-11-14 ENCOUNTER — Encounter: Payer: Self-pay | Admitting: Hematology and Oncology

## 2023-11-14 ENCOUNTER — Ambulatory Visit

## 2023-11-14 DIAGNOSIS — Z79810 Long term (current) use of selective estrogen receptor modulators (SERMs): Secondary | ICD-10-CM | POA: Insufficient documentation

## 2023-11-14 DIAGNOSIS — Z1501 Genetic susceptibility to malignant neoplasm of breast: Secondary | ICD-10-CM | POA: Diagnosis not present

## 2023-11-14 DIAGNOSIS — Z1502 Genetic susceptibility to malignant neoplasm of ovary: Secondary | ICD-10-CM | POA: Insufficient documentation

## 2023-11-14 DIAGNOSIS — Z1509 Genetic susceptibility to other malignant neoplasm: Secondary | ICD-10-CM | POA: Insufficient documentation

## 2023-11-14 NOTE — Progress Notes (Signed)
 [facility] CONSULT NOTE  Patient Care Team: Stamey, Katherine Rude, FNP as PCP - General (Family Medicine)  CHIEF COMPLAINTS/PURPOSE OF CONSULTATION:  BRCA 2 genetic mutation.  ASSESSMENT & PLAN:   This is a very pleasant 53 year old postmenopausal female patient who was found to have a single pathogenic variant in the BRCA2 gene, also tested positive for a pathogenic variant in the FH gene.    She is doing well, tolerating tamoxifen  well. No concerns on recent exam at gynecology per patient. Mammogram scheduled for end of June. I asked her to come for some labs at the end of this month, CBC and CMP We will continue intensive screening for now. She is thinking about bilateral mastectomy but is clearly overwhelmed with everything. I asked her to reach out back to us  in case she would like us  to make a referral to surgery team She can come back and see me in 6 months.   HISTORY OF PRESENTING ILLNESS:  Katherine Gay 53 y.o. female is here because of BRCA 2 mutation.  Katherine Gay has significant family history of breast cancer in mom at the age of 48 who also happened to have ovarian cancer.  Maternal uncle had prostate cancer.  Maternal grandmother had breast cancer at a very early age at the age of 3.  She has proceeded with genetic testing which showed a pathogenic variant in BRCA2.  She also has specific variant in the FH gene as well.   Discussed the use of AI scribe software for clinical note transcription with the patient, who gave verbal consent to proceed.  History of Present Illness    The patient, with a known BRCA2 mutation, presents for a routine follow-up.  She is now taking care of her mom who was diagnosed with breast cancer recurrence. She had a recent visit with her gyn and had a good breast exam. She also re scheduled her mammogram. Besides the stress of dealing with her mom's health, she has not noticed any major issues. She is tolerating tamoxifen  well. She  noticed that there was some interaction mentioned between tamoxifen  and toe nail fungus medication Rest of the pertinent 10 point ROS reviewed and neg.  MEDICAL HISTORY:  Past Medical History:  Diagnosis Date   Anemia    hx of anemia, 11/01/21 Hgb 8.7   BRCA2 gene mutation positive 06/28/2022   Follows w/ Dr. Murleen Gay, [facility], oncologist.   Depression    hx of, resolved as of 11/02/22 per pt   History of kidney stones    HSV-2 infection    HTN (hypertension)    Follows w/ Katherine Alpers, PA @ [facility].   Nephrolithiasis    bilateral -- nonobstructive per ct 11-27-2014   Right ureteral stone    Sepsis (HCC)    Secondary to infected renal stone. Hospitalized 10/2021.   Type 2 diabetes mellitus (HCC)    Follows w/ Katherine Alpers, PA @ [facility].   Urinary frequency     SURGICAL HISTORY: Past Surgical History:  Procedure Laterality Date   CESAREAN SECTION  2004   CYSTOSCOPY W/ URETERAL STENT PLACEMENT Right 10/16/2021   Procedure: CYSTOSCOPY WITH RETROGRADE PYELOGRAM/URETERAL STENT PLACEMENT;  Surgeon: Katherine Coyer, MD;  Location: [facility];  Service: Urology;  Laterality: Right;   CYSTOSCOPY WITH RETROGRADE PYELOGRAM, URETEROSCOPY AND STENT PLACEMENT Right 08/01/2015   Procedure: CYSTOSCOPY WITH RIGHT RETROGRADE PYELOGRAM, URETEROSCOPY AND STENT PLACEMENT;  Surgeon: Katherine Banker, MD;  Location: [facility];  Service:  Urology;  Laterality: Right;   HOLMIUM LASER APPLICATION Right 08/01/2015   Procedure: HOLMIUM LASER APPLICATION;  Surgeon: Katherine Banker, MD;  Location: [facility];  Service: Urology;  Laterality: Right;   LAPAROSCOPIC CHOLECYSTECTOMY  08-13-2009   ROBOTIC ASSISTED TOTAL HYSTERECTOMY WITH BILATERAL SALPINGO OOPHERECTOMY Bilateral 11/18/2022   Procedure: XI ROBOTIC ASSISTED TOTAL HYSTERECTOMY WITH BILATERAL SALPINGO OOPHORECTOMY LYSIS OF ADHESIONS; CYSTOSCOPY;  Surgeon: Katherine Bidding, MD;  Location: [facility];  Service:  Gynecology;  Laterality: Bilateral;   WISDOM TOOTH EXTRACTION  2011    SOCIAL HISTORY: Social History   Socioeconomic History   Marital status: Married    Spouse name: Not on file   Number of children: Not on file   Years of education: Not on file   Highest education level: Not on file  Occupational History   Not on file  Tobacco Use   Smoking status: Never   Smokeless tobacco: Never  Vaping Use   Vaping status: Never Used  Substance and Sexual Activity   Alcohol use: Yes    Comment: Very rarely   Drug use: No   Sexual activity: Not on file  Other Topics Concern   Not on file  Social History Narrative   ** Merged History Encounter **       ** Merged History Encounter **       Social Drivers of Corporate investment Gay Strain: Not on file  Food Insecurity: Not on file  Transportation Needs: Not on file  Physical Activity: Not on file  Stress: Not on file  Social Connections: Not on file  Intimate Partner Violence: Not on file    FAMILY HISTORY: Family History  Problem Relation Age of Onset   Breast cancer Mother 67   Ovarian cancer Mother 70   Uterine cancer Mother 71   Squamous cell carcinoma Mother 47       hand   Prostate cancer Maternal Uncle        dx < 60   Cancer Paternal Uncle        x2 pat uncles; unknown type   Breast cancer Maternal Grandmother 27   Breast cancer Other 28       MGM's sister   Prostate cancer Other        MGM's brother; dx 67s   Testicular cancer Cousin        maternal cousin; dx 30s    ALLERGIES:  is allergic to cat dander and pollen extract.  MEDICATIONS:  Current Outpatient Medications  Medication Sig Dispense Refill   acetaminophen  (TYLENOL ) 500 MG tablet Take 2 tablets po every 6 hours for 5 days then prn post operative pain 100 tablet 1   atorvastatin  (LIPITOR) 20 MG tablet Take 1 tablet (20 mg total) by mouth daily. (Patient not taking: Reported on 11/02/2022) 90 tablet 3   Calcium  Carbonate Antacid (TUMS PO)  Take 1-2 tablets by mouth daily as needed (acid reflux).     cephALEXin  (KEFLEX ) 500 MG capsule Take 1 capsule (500 mg total) by mouth 2 (two) times daily. 20 capsule 0   clobetasol ointment (TEMOVATE) 0.05 % Apply 1 application. topically daily as needed (dermatitis).     cyclobenzaprine  (FLEXERIL ) 10 MG tablet Take 1 tablet (10 mg total) by mouth 3 (three) times daily as needed for muscle spasms. 10 tablet 0   Docosanol (ABREVA EX) Apply 1 application. topically 3 (three) times daily as needed (cold sore).     ibuprofen  (ADVIL ) 600  MG tablet take 1 tablet po  pc every 6 hours for 5 days for post operative pain then prn 30 tablet 1   ibuprofen  (ADVIL ) 600 MG tablet Take 1 tablet (600 mg total) by mouth every 6 (six) hours as needed. 30 tablet 0   linagliptin  (TRADJENTA ) 5 MG TABS tablet Take 1 tablet (5 mg total) by mouth daily. (Patient not taking: Reported on 10/30/2021) 30 tablet 1   loratadine (CLARITIN) 10 MG tablet Take 10 mg by mouth daily as needed for allergies.     losartan  (COZAAR ) 100 MG tablet Take 1 tablet (100 mg total) by mouth daily. 90 tablet 1   metFORMIN  (GLUCOPHAGE ) 500 MG tablet Take 1,000 mg by mouth 2 (two) times daily with a meal.      ondansetron  (ZOFRAN -ODT) 8 MG disintegrating tablet Take 1 tablet (8 mg total) by mouth every 8 (eight) hours as needed. 10 tablet 0   oxyCODONE  (ROXICODONE ) 5 MG immediate release tablet take 1 tablet po every 6 hours as needed for breakthrough post operative pain 16 tablet 0   RYBELSUS  7 MG TABS Take 2 tablets by mouth daily. (Patient taking differently: Take 14 mg by mouth daily.) 60 tablet 2   tamoxifen  (NOLVADEX ) 20 MG tablet TAKE 1 TABLET BY MOUTH EVERY DAY 90 tablet 0   triamcinolone  cream (KENALOG) 0.1 % Apply 1 application  topically See admin instructions. 1 application 1- times daily as needed for exzema on hands     valACYclovir  (VALTREX ) 500 MG tablet Take 500-1,000 mg by mouth See admin instructions. Take 500-1000mg  by mouth 2-3  times a day as needed for a flare up     No current facility-administered medications for this visit.     PHYSICAL EXAMINATION: ECOG PERFORMANCE STATUS: 0 - Asymptomatic  There were no vitals filed for this visit.  There were no vitals filed for this visit.  PE deferred, telephone visit  LABORATORY DATA:  I have reviewed the data as listed Lab Results  Component Value Date   WBC 6.8 11/16/2022   HGB 12.3 11/16/2022   HCT 37.6 11/16/2022   MCV 87.9 11/16/2022   PLT 337 11/16/2022     Chemistry      Component Value Date/Time   NA 136 11/16/2022 1430   K 4.0 11/16/2022 1430   CL 103 11/16/2022 1430   CO2 24 11/16/2022 1430   BUN 16 11/16/2022 1430   CREATININE 1.01 (H) 11/16/2022 1430      Component Value Date/Time   CALCIUM  9.0 11/16/2022 1430   ALKPHOS 48 10/31/2021 0354   AST 12 (L) 10/31/2021 0354   ALT 9 10/31/2021 0354   BILITOT 0.5 10/31/2021 0354       RADIOGRAPHIC STUDIES: I have personally reviewed the radiological images as listed and agreed with the findings in the report. No results found.  All questions were answered. The patient knows to call the clinic with any problems, questions or concerns. I spent 30 minutes in the care of this patient including H and P, review of records, counseling and coordination of care.     Katherine Arms, MD 11/14/2023 6:16 PM

## 2023-11-25 ENCOUNTER — Telehealth: Payer: Self-pay | Admitting: Hematology and Oncology

## 2023-11-25 NOTE — Telephone Encounter (Signed)
 left vm about rescheduled appt date and time

## 2023-12-02 ENCOUNTER — Ambulatory Visit

## 2023-12-06 ENCOUNTER — Ambulatory Visit: Admitting: Hematology and Oncology

## 2023-12-06 ENCOUNTER — Other Ambulatory Visit

## 2023-12-15 ENCOUNTER — Inpatient Hospital Stay: Attending: Hematology and Oncology

## 2023-12-19 ENCOUNTER — Ambulatory Visit
Admission: RE | Admit: 2023-12-19 | Discharge: 2023-12-19 | Disposition: A | Discharge status: Home / Self Care | Source: Ambulatory Visit | Attending: Obstetrics and Gynecology | Admitting: Obstetrics and Gynecology

## 2023-12-19 DIAGNOSIS — Z1231 Encounter for screening mammogram for malignant neoplasm of breast: Secondary | ICD-10-CM

## 2023-12-25 ENCOUNTER — Other Ambulatory Visit: Payer: Self-pay | Admitting: Hematology and Oncology

## 2024-05-14 ENCOUNTER — Ambulatory Visit (HOSPITAL_COMMUNITY)

## 2024-05-16 ENCOUNTER — Ambulatory Visit: Admitting: Hematology and Oncology

## 2024-06-04 ENCOUNTER — Ambulatory Visit (HOSPITAL_COMMUNITY)
Admission: RE | Admit: 2024-06-04 | Discharge: 2024-06-04 | Disposition: A | Discharge status: Home / Self Care | Source: Ambulatory Visit | Attending: Hematology and Oncology | Admitting: Hematology and Oncology

## 2024-06-04 DIAGNOSIS — Z1501 Genetic susceptibility to malignant neoplasm of breast: Secondary | ICD-10-CM | POA: Insufficient documentation

## 2024-06-04 DIAGNOSIS — Z1231 Encounter for screening mammogram for malignant neoplasm of breast: Secondary | ICD-10-CM | POA: Insufficient documentation

## 2024-06-04 DIAGNOSIS — Z1507 Genetic susceptibility to malignant neoplasm of urinary tract: Secondary | ICD-10-CM | POA: Insufficient documentation

## 2024-06-04 DIAGNOSIS — Z15068 Genetic susceptibility to other malignant neoplasm of digestive system: Secondary | ICD-10-CM | POA: Diagnosis present

## 2024-06-04 DIAGNOSIS — Z803 Family history of malignant neoplasm of breast: Secondary | ICD-10-CM | POA: Insufficient documentation

## 2024-06-04 DIAGNOSIS — Z1589 Genetic susceptibility to other disease: Secondary | ICD-10-CM | POA: Insufficient documentation

## 2024-06-04 DIAGNOSIS — Z1509 Genetic susceptibility to other malignant neoplasm: Secondary | ICD-10-CM | POA: Diagnosis present

## 2024-06-04 MED ORDER — GADOBUTROL 1 MMOL/ML IV SOLN
9.0000 mL | Freq: Once | INTRAVENOUS | Status: AC | PRN
  Administered 2024-06-04: 9 mL via INTRAVENOUS

## 2024-06-13 ENCOUNTER — Inpatient Hospital Stay

## 2024-06-13 ENCOUNTER — Inpatient Hospital Stay: Payer: Self-pay | Attending: Hematology and Oncology | Admitting: Hematology and Oncology

## 2024-06-13 VITALS — BP 124/73 | HR 86 | Temp 97.9°F | Resp 17 | Wt 183.1 lb

## 2024-06-13 DIAGNOSIS — Z1509 Genetic susceptibility to other malignant neoplasm: Secondary | ICD-10-CM

## 2024-06-13 DIAGNOSIS — Z1507 Genetic susceptibility to malignant neoplasm of urinary tract: Secondary | ICD-10-CM | POA: Diagnosis not present

## 2024-06-13 DIAGNOSIS — Z1589 Genetic susceptibility to other disease: Secondary | ICD-10-CM

## 2024-06-13 DIAGNOSIS — Z15068 Genetic susceptibility to other malignant neoplasm of digestive system: Secondary | ICD-10-CM | POA: Diagnosis not present

## 2024-06-13 DIAGNOSIS — Z1501 Genetic susceptibility to malignant neoplasm of breast: Secondary | ICD-10-CM

## 2024-06-13 LAB — CMP (CANCER CENTER ONLY)
ALT: 9 U/L (ref 0–44)
AST: 17 U/L (ref 15–41)
Albumin: 4.2 g/dL (ref 3.5–5.0)
Alkaline Phosphatase: 49 U/L (ref 38–126)
Anion gap: 12 (ref 5–15)
BUN: 10 mg/dL (ref 6–20)
CO2: 24 mmol/L (ref 22–32)
Calcium: 9.1 mg/dL (ref 8.9–10.3)
Chloride: 102 mmol/L (ref 98–111)
Creatinine: 0.9 mg/dL (ref 0.44–1.00)
GFR, Estimated: >60 mL/min
Glucose, Bld: 168 mg/dL — ABNORMAL HIGH (ref 70–99)
Potassium: 4.3 mmol/L (ref 3.5–5.1)
Sodium: 138 mmol/L (ref 135–145)
Total Bilirubin: 0.3 mg/dL (ref 0.0–1.2)
Total Protein: 7.1 g/dL (ref 6.5–8.1)

## 2024-06-13 LAB — CBC WITH DIFFERENTIAL/PLATELET
Abs Immature Granulocytes: 0.01 K/uL (ref 0.00–0.07)
Basophils Absolute: 0 K/uL (ref 0.0–0.1)
Basophils Relative: 1 %
Eosinophils Absolute: 0.2 K/uL (ref 0.0–0.5)
Eosinophils Relative: 4 %
HCT: 39.2 % (ref 36.0–46.0)
Hemoglobin: 13.1 g/dL (ref 12.0–15.0)
Immature Granulocytes: 0 %
Lymphocytes Relative: 41 %
Lymphs Abs: 2.1 K/uL (ref 0.7–4.0)
MCH: 29.7 pg (ref 26.0–34.0)
MCHC: 33.4 g/dL (ref 30.0–36.0)
MCV: 88.9 fL (ref 80.0–100.0)
Monocytes Absolute: 0.4 K/uL (ref 0.1–1.0)
Monocytes Relative: 8 %
Neutro Abs: 2.3 K/uL (ref 1.7–7.7)
Neutrophils Relative %: 46 %
Platelets: 264 K/uL (ref 150–400)
RBC: 4.41 MIL/uL (ref 3.87–5.11)
RDW: 15.2 % (ref 11.5–15.5)
WBC: 5 K/uL (ref 4.0–10.5)
nRBC: 0 % (ref 0.0–0.2)

## 2024-06-13 NOTE — Progress Notes (Signed)
 [facility] CONSULT NOTE  Patient Care Team: Stamey, Katherine POUR, FNP as PCP - General (Family Medicine)  CHIEF COMPLAINTS/PURPOSE OF CONSULTATION:  BRCA 2 genetic mutation.  ASSESSMENT & PLAN:   This is a very pleasant 54 year old postmenopausal female patient who was found to have a single pathogenic variant in the BRCA2 gene, also tested positive for a pathogenic variant in the FH gene.    Assessment and Plan Assessment & Plan BRCA2 gene mutation with increased risk for breast cancer She adheres to surveillance and tamoxifen  therapy. Prophylactic mastectomy discussed but deferred. She expressed some concern about gadolinium, so we talked about CEM annually but she is worried going a whole yr without imaging. So we agreed on continuing current screening. - Ordered annual mammogram for July and annual breast MRI for December. - Continued tamoxifen ; ordered liver function tests for monitoring. - Discussed prophylactic mastectomy; deferred surgery, will revisit as needed. - Advised lifestyle modifications: stress management, healthy diet, regular exercise, minimize alcohol. - Reinforced monthly self-breast exams and prompt reporting of new findings. - Advised regular dermatologic skin checks and colon cancer screening. - Arranged follow-up in one year with ongoing surveillance and coordination with primary care and gynecology for a visit in between our visits.   HISTORY OF PRESENTING ILLNESS:  Katherine Gay 54 y.o. female is here because of BRCA 2 mutation.  History of Present Illness    Discussed the use of AI scribe software for clinical note transcription with the patient, who gave verbal consent to proceed.  History of Present Illness Katherine Gay is a 54 year old female with a BRCA2 mutation who presents for high-risk breast cancer surveillance.  She carries a BRCA2 mutation and is considered high risk for breast cancer, with a significant family history  including her mother who died from metastatic breast cancer at age 79. She expresses concern about the frequency of imaging and cumulative gadolinium exposure from MRIs, and is open to considering contrast-enhanced mammography, but is agreeable to continuing the current regimen. She denies new breast or nipple changes, skin thickening, axillary lymphadenopathy, or new skin lesions. She has initiated regular dermatologic skin checks.  She previously underwent total hysterectomy with bilateral oophorectomy for risk reduction and is relieved to have completed this intervention. She has completed Cologuard testing for colon cancer screening, as she is unable to tolerate colonoscopy preparation despite multiple attempts.  She takes tamoxifen  in the morning and terbinafine at night for persistent onychomycosis, and is aware of the potential drug interaction that may reduce tamoxifen  efficacy.  She is experiencing significant emotional distress related to the recent death of her mother and the demands of managing her mother's estate, resulting in increased alcohol consumption, which she attributes to current stress and grief. She expects this to be temporary.    MEDICAL HISTORY:  Past Medical History:  Diagnosis Date   Anemia    hx of anemia, 11/01/21 Hgb 8.7   BRCA2 gene mutation positive 06/28/2022   Follows w/ Dr. Amber Gay, [facility], oncologist.   Depression    hx of, resolved as of 11/02/22 per pt   History of kidney stones    HSV-2 infection    HTN (hypertension)    Follows w/ Katherine Maize, PA @ [facility].   Nephrolithiasis    bilateral -- nonobstructive per ct 11-27-2014   Right ureteral stone    Sepsis (HCC)    Secondary to infected renal stone. Hospitalized 10/2021.   Type 2 diabetes mellitus (HCC)  Follows w/ Katherine Maize, PA @ [facility].   Urinary frequency     SURGICAL HISTORY: Past Surgical History:  Procedure Laterality Date   CESAREAN SECTION  2004   CYSTOSCOPY W/ URETERAL  STENT PLACEMENT Right 10/16/2021   Procedure: CYSTOSCOPY WITH RETROGRADE PYELOGRAM/URETERAL STENT PLACEMENT;  Surgeon: Nieves Cough, MD;  Location: [facility];  Service: Urology;  Laterality: Right;   CYSTOSCOPY WITH RETROGRADE PYELOGRAM, URETEROSCOPY AND STENT PLACEMENT Right 08/01/2015   Procedure: CYSTOSCOPY WITH RIGHT RETROGRADE PYELOGRAM, URETEROSCOPY AND STENT PLACEMENT;  Surgeon: Morene LELON Salines, MD;  Location: [facility];  Service: Urology;  Laterality: Right;   HOLMIUM LASER APPLICATION Right 08/01/2015   Procedure: HOLMIUM LASER APPLICATION;  Surgeon: Morene LELON Salines, MD;  Location[facility];  Service: Urology;  Laterality: Right;   LAPAROSCOPIC CHOLECYSTECTOMY  08-13-2009   ROBOTIC ASSISTED TOTAL HYSTERECTOMY WITH BILATERAL SALPINGO OOPHERECTOMY Bilateral 11/18/2022   Procedure: XI ROBOTIC ASSISTED TOTAL HYSTERECTOMY WITH BILATERAL SALPINGO OOPHORECTOMY LYSIS OF ADHESIONS; CYSTOSCOPY;  Surgeon: Darcel Pool, MD;  Location: [facility];  Service: Gynecology;  Laterality: Bilateral;   WISDOM TOOTH EXTRACTION  2011    SOCIAL HISTORY: Social History   Socioeconomic History   Marital status: Married    Spouse name: Not on file   Number of children: Not on file   Years of education: Not on file   Highest education level: Not on file  Occupational History   Not on file  Tobacco Use   Smoking status: Never   Smokeless tobacco: Never  Vaping Use   Vaping status: Never Used  Substance and Sexual Activity   Alcohol use: Yes    Comment: Very rarely   Drug use: No   Sexual activity: Not on file  Other Topics Concern   Not on file  Social History Narrative   ** Merged History Encounter **       ** Merged History Encounter **       Social Drivers of Health   Tobacco Use: Low Risk  (12/19/2023)   Received from [facility]   Patient History    Smoking Tobacco Use: Never    Smokeless Tobacco Use: Never     Passive Exposure: Not on file  Financial Resource Strain: Not on file  Food Insecurity: Not on file  Transportation Needs: Not on file  Physical Activity: Not on file  Stress: Not on file  Social Connections: Not on file  Intimate Partner Violence: Not on file  Depression (PHQ2-9): Low Risk (06/13/2024)   Depression (PHQ2-9)    PHQ-2 Score: 0  Alcohol Screen: Not on file  Housing: Unknown (12/19/2023)   Received from [facility]   Epic    Unable to Pay for Housing in the Last Year: Not on file    Number of Times Moved in the Last Year: Not on file    At any time in the past 12 months, were you homeless or living in a shelter (including now)?: No  Utilities: Not on file  Health Literacy: Not on file    FAMILY HISTORY: Family History  Problem Relation Age of Onset   Breast cancer Mother 37   Ovarian cancer Mother 37   Uterine cancer Mother 28   Squamous cell carcinoma Mother 39       hand   Prostate cancer Maternal Uncle        dx < 60   Cancer Paternal Uncle  x2 pat uncles; unknown type   Breast cancer Maternal Grandmother 31   Breast cancer Other 59       MGM's sister   Prostate cancer Other        MGM's brother; dx 66s   Testicular cancer Cousin        maternal cousin; dx 30s    ALLERGIES:  is allergic to cat dander and pollen extract.  MEDICATIONS:  Current Outpatient Medications  Medication Sig Dispense Refill   acetaminophen  (TYLENOL ) 500 MG tablet Take 2 tablets po every 6 hours for 5 days then prn post operative pain 100 tablet 1   atorvastatin  (LIPITOR) 20 MG tablet Take 1 tablet (20 mg total) by mouth daily. 90 tablet 3   Calcium  Carbonate Antacid (TUMS PO) Take 1-2 tablets by mouth daily as needed (acid reflux).     cephALEXin  (KEFLEX ) 500 MG capsule Take 1 capsule (500 mg total) by mouth 2 (two) times daily. 20 capsule 0   clobetasol ointment (TEMOVATE) 0.05 % Apply 1 application. topically daily as needed (dermatitis).      cyclobenzaprine  (FLEXERIL ) 10 MG tablet Take 1 tablet (10 mg total) by mouth 3 (three) times daily as needed for muscle spasms. 10 tablet 0   Docosanol (ABREVA EX) Apply 1 application. topically 3 (three) times daily as needed (cold sore).     ibuprofen  (ADVIL ) 600 MG tablet take 1 tablet po  pc every 6 hours for 5 days for post operative pain then prn 30 tablet 1   ibuprofen  (ADVIL ) 600 MG tablet Take 1 tablet (600 mg total) by mouth every 6 (six) hours as needed. 30 tablet 0   linagliptin  (TRADJENTA ) 5 MG TABS tablet Take 1 tablet (5 mg total) by mouth daily. 30 tablet 1   loratadine (CLARITIN) 10 MG tablet Take 10 mg by mouth daily as needed for allergies.     losartan  (COZAAR ) 100 MG tablet Take 1 tablet (100 mg total) by mouth daily. 90 tablet 1   metFORMIN  (GLUCOPHAGE ) 500 MG tablet Take 1,000 mg by mouth 2 (two) times daily with a meal.      ondansetron  (ZOFRAN -ODT) 8 MG disintegrating tablet Take 1 tablet (8 mg total) by mouth every 8 (eight) hours as needed. 10 tablet 0   oxyCODONE  (ROXICODONE ) 5 MG immediate release tablet take 1 tablet po every 6 hours as needed for breakthrough post operative pain 16 tablet 0   RYBELSUS  7 MG TABS Take 2 tablets by mouth daily. (Patient taking differently: Take 14 mg by mouth daily.) 60 tablet 2   tamoxifen  (NOLVADEX ) 20 MG tablet TAKE 1 TABLET BY MOUTH EVERY DAY 90 tablet 1   terbinafine (LAMISIL) 250 MG tablet Take 250 mg by mouth daily.     triamcinolone  cream (KENALOG) 0.1 % Apply 1 application  topically See admin instructions. 1 application 1- times daily as needed for exzema on hands     valACYclovir  (VALTREX ) 500 MG tablet Take 500-1,000 mg by mouth See admin instructions. Take 500-1000mg  by mouth 2-3 times a day as needed for a flare up     No current facility-administered medications for this visit.     PHYSICAL EXAMINATION: ECOG PERFORMANCE STATUS: 0 - Asymptomatic  Vitals:   06/13/24 1354  BP: 124/73  Pulse: 86  Resp: 17  Temp: 97.9  F (36.6 C)  SpO2: 100%    Filed Weights   06/13/24 1354  Weight: 183 lb 1.6 oz (83.1 kg)   She appears well, no distress No  cervical adenopathy Bilateral breasts inspected. No palpable mass. No regional adenopathy No LE edema.  LABORATORY DATA:  I have reviewed the data as listed Lab Results  Component Value Date   WBC 6.8 11/16/2022   HGB 12.3 11/16/2022   HCT 37.6 11/16/2022   MCV 87.9 11/16/2022   PLT 337 11/16/2022     Chemistry      Component Value Date/Time   NA 136 11/16/2022 1430   K 4.0 11/16/2022 1430   CL 103 11/16/2022 1430   CO2 24 11/16/2022 1430   BUN 16 11/16/2022 1430   CREATININE 1.01 (H) 11/16/2022 1430      Component Value Date/Time   CALCIUM  9.0 11/16/2022 1430   ALKPHOS 48 10/31/2021 0354   AST 12 (L) 10/31/2021 0354   ALT 9 10/31/2021 0354   BILITOT 0.5 10/31/2021 0354       RADIOGRAPHIC STUDIES: I have personally reviewed the radiological images as listed and agreed with the findings in the report. MR BREAST BILATERAL W WO CONTRAST INC CAD Result Date: 06/04/2024 CLINICAL DATA:  54 year old female presents for high-risk screening breast MRI. Patient has a family history of breast cancer with her mother and maternal grandmother diagnosed with breast cancer. Positive for BRCA2. EXAM: BILATERAL BREAST MRI WITH AND WITHOUT CONTRAST TECHNIQUE: Multiplanar, multisequence MR images of both breasts were obtained prior to and following the intravenous administration of 9 ml of Gadavist  Three-dimensional MR images were rendered by post-processing of the original MR data on an independent workstation. The three-dimensional MR images were interpreted, and findings are reported in the following complete MRI report for this study. Three dimensional images were evaluated at the independent interpreting workstation using the DynaCAD thin client. COMPARISON:  Previous exam(s). FINDINGS: Breast composition: b. Scattered fibroglandular tissue. Background  parenchymal enhancement: Mild Right breast: Several scattered sub-centimeter inflamed cyst. No mass or abnormal enhancement. Left breast: Several scattered sub-centimeter inflamed cysts. No mass or abnormal enhancement. Lymph nodes: No abnormal appearing lymph nodes. Ancillary findings:  None. IMPRESSION: Benign findings as above, no MRI evidence of malignancy. RECOMMENDATION: 1. Continued annual screening mammograms, next mammogram due July 2026. 2. Continued annual high-risk screening breast MRI as clinically indicated. The [employer] of [employer] and The Society of Breast Imaging recommends annual breast MRI and mammography in patients with genetics based increased risk, a calculated lifetime risk of 20% for more, a history of radiation therapy at a young age, personal history of breast cancer with dense breast tissue and those diagnosed with breast cancer before age 17. Others with a personal history of breast cancer should strongly consider supplemental screening with MRI, especially if other risk factors are present. Women with dense breasts who desire supplemental screening should undergo breast MRI. Women with atypia or LCIS should consider supplemental surveillance with MRI, especially if other risk factors are present. BI-RADS CATEGORY  2: Benign. Electronically Signed   By: Curtistine Noble   On: 06/04/2024 14:40    All questions were answered. The patient knows to call the clinic with any problems, questions or concerns. I spent 30 minutes in the care of this patient including H and P, review of records, counseling and coordination of care.     Katherine Stalls, MD 06/13/2024 2:02 PM

## 2024-12-21 ENCOUNTER — Ambulatory Visit

## 2025-05-29 ENCOUNTER — Other Ambulatory Visit

## 2025-06-13 ENCOUNTER — Inpatient Hospital Stay: Admitting: Hematology and Oncology
